# Patient Record
Sex: Male | Born: 1955 | Race: White | Hispanic: No | Marital: Single | Smoking: Current every day smoker
Health system: Southern US, Community
[De-identification: ages and names within clinical notes are randomized; demographics above are authoritative.]

## PROBLEM LIST (undated history)

## (undated) DIAGNOSIS — E7800 Pure hypercholesterolemia, unspecified: Secondary | ICD-10-CM

---

## 2013-03-26 ENCOUNTER — Observation Stay (HOSPITAL_COMMUNITY)
Admission: EM | Admit: 2013-03-26 | Discharge: 2013-03-27 | Disposition: A | Discharge status: Home / Self Care | Payer: Non-veteran care | Attending: Family Medicine | Admitting: Family Medicine

## 2013-03-26 ENCOUNTER — Encounter (HOSPITAL_COMMUNITY): Payer: Self-pay | Admitting: Emergency Medicine

## 2013-03-26 ENCOUNTER — Emergency Department (HOSPITAL_COMMUNITY): Payer: Non-veteran care

## 2013-03-26 DIAGNOSIS — R079 Chest pain, unspecified: Principal | ICD-10-CM | POA: Insufficient documentation

## 2013-03-26 DIAGNOSIS — I4510 Unspecified right bundle-branch block: Secondary | ICD-10-CM | POA: Insufficient documentation

## 2013-03-26 DIAGNOSIS — E785 Hyperlipidemia, unspecified: Secondary | ICD-10-CM | POA: Insufficient documentation

## 2013-03-26 DIAGNOSIS — F17200 Nicotine dependence, unspecified, uncomplicated: Secondary | ICD-10-CM | POA: Insufficient documentation

## 2013-03-26 DIAGNOSIS — R002 Palpitations: Secondary | ICD-10-CM | POA: Insufficient documentation

## 2013-03-26 HISTORY — DX: Pure hypercholesterolemia, unspecified: E78.00

## 2013-03-26 LAB — CBC WITH DIFFERENTIAL/PLATELET
Basophils Absolute: 0 10*3/uL (ref 0.0–0.1)
Basophils Relative: 0 % (ref 0–1)
Eosinophils Absolute: 0.3 10*3/uL (ref 0.0–0.7)
Eosinophils Relative: 4 % (ref 0–5)
HCT: 44.4 % (ref 39.0–52.0)
HEMOGLOBIN: 15.6 g/dL (ref 13.0–17.0)
LYMPHS ABS: 2.3 10*3/uL (ref 0.7–4.0)
LYMPHS PCT: 32 % (ref 12–46)
MCH: 32.5 pg (ref 26.0–34.0)
MCHC: 35.1 g/dL (ref 30.0–36.0)
MCV: 92.5 fL (ref 78.0–100.0)
MONOS PCT: 8 % (ref 3–12)
Monocytes Absolute: 0.6 10*3/uL (ref 0.1–1.0)
NEUTROS ABS: 4.1 10*3/uL (ref 1.7–7.7)
NEUTROS PCT: 57 % (ref 43–77)
PLATELETS: 177 10*3/uL (ref 150–400)
RBC: 4.8 MIL/uL (ref 4.22–5.81)
RDW: 12.7 % (ref 11.5–15.5)
WBC: 7.2 10*3/uL (ref 4.0–10.5)

## 2013-03-26 LAB — BASIC METABOLIC PANEL
BUN: 14 mg/dL (ref 6–23)
CHLORIDE: 102 meq/L (ref 96–112)
CO2: 25 mEq/L (ref 19–32)
Calcium: 9 mg/dL (ref 8.4–10.5)
Creatinine, Ser: 0.71 mg/dL (ref 0.50–1.35)
GFR calc Af Amer: >90 mL/min (ref >90)
GLUCOSE: 117 mg/dL — AB (ref 70–99)
Potassium: 3.7 mEq/L (ref 3.7–5.3)
SODIUM: 141 meq/L (ref 137–147)

## 2013-03-26 LAB — TROPONIN I: Troponin I: <0.3 ng/mL (ref <0.30)

## 2013-03-26 MED ORDER — ALPRAZOLAM 0.25 MG PO TABS
0.2500 mg | ORAL_TABLET | Freq: Three times a day (TID) | ORAL | Status: DC | PRN
  Administered 2013-03-26: 0.25 mg via ORAL
  Filled 2013-03-26: qty 1

## 2013-03-26 MED ORDER — MORPHINE SULFATE 2 MG/ML IJ SOLN: 2.0000 mg | INTRAMUSCULAR | Status: DC | PRN

## 2013-03-26 MED ORDER — ACETAMINOPHEN 500 MG PO TABS
1000.0000 mg | ORAL_TABLET | Freq: Once | ORAL | Status: AC
  Administered 2013-03-26: 1000 mg via ORAL
  Filled 2013-03-26: qty 2

## 2013-03-26 MED ORDER — ENOXAPARIN SODIUM 40 MG/0.4ML ~~LOC~~ SOLN
40.0000 mg | SUBCUTANEOUS | Status: DC
  Administered 2013-03-26: 40 mg via SUBCUTANEOUS
  Filled 2013-03-26: qty 0.4

## 2013-03-26 MED ORDER — SODIUM CHLORIDE 0.9 % IJ SOLN
3.0000 mL | Freq: Two times a day (BID) | INTRAMUSCULAR | Status: DC
  Administered 2013-03-26 – 2013-03-27 (×2): 3 mL via INTRAVENOUS

## 2013-03-26 MED ORDER — SODIUM CHLORIDE 0.9 % IV SOLN: 250.0000 mL | INTRAVENOUS | Status: DC | PRN

## 2013-03-26 MED ORDER — SODIUM CHLORIDE 0.9 % IJ SOLN: 3.0000 mL | INTRAMUSCULAR | Status: DC | PRN

## 2013-03-26 NOTE — H&P (Signed)
PCP:   No PCP Per Patient   Chief Complaint:  Palpitations  HPI: 58 year old male with a history of hyperlipidemia, tobacco abuse,? Anxiety who came to the ED after patient had palpitations with dizziness. Patient says that he has several weeks of palpitations usually it is associated with dizziness and chest heaviness. He had episode of palpitations last week and again today also had chest pressure which was nonradiating. Patient also complains of profuse sweating at the time of this episode which only lasted for 2 minutes. Patient does report increased stress report and thought that his symptoms are due to anxiety. Patient does not have history of CAD he does smoke cigarettes there is no significant family history of CAD. Patient has history of hyperlipidemia. In the ED, cardiac enzymes x1 are negative, EKG shows incomplete right bundle branch block with inverted T waves in lead 3 and flattening in aVF.  Allergies:  No Known Allergies    Past Medical History  Diagnosis Date  . Hypercholesterolemia     History reviewed. No pertinent past surgical history.  Prior to Admission medications   Medication Sig Start Date End Date Taking? Authorizing Provider  acetaminophen (TYLENOL) 500 MG tablet Take 500 mg by mouth every 6 (six) hours as needed for mild pain.   Yes Historical Provider, MD  DM-Doxylamine-Acetaminophen (NYQUIL COLD & FLU PO) Take 30 mLs by mouth once as needed (congestion).   Yes Historical Provider, MD    Social History:  reports that he has been smoking.  He does not have any smokeless tobacco history on file. He reports that he drinks alcohol. He reports that he does not use illicit drugs.  No family history on file.   All the positives are listed in BOLD  Review of Systems:  HEENT: Headache, blurred vision, runny nose, sore throat Neck: Hypothyroidism, hyperthyroidism,,lymphadenopathy Chest : Shortness of breath, history of COPD, Asthma Heart : Chest pain,  history of coronary arterey disease GI:  Nausea, vomiting, diarrhea, constipation, GERD GU: Dysuria, urgency, frequency of urination, hematuria Neuro: Stroke, seizures, syncope Psych: Depression, anxiety, hallucinations   Physical Exam: Blood pressure 108/69, pulse 61, temperature 98.2 F (36.8 C), temperature source Oral, resp. rate 16, height _0  (1.702 m), weight 85.73 kg (189 lb), SpO2 96.00%. Constitutional:   Patient is a well-developed and well-nourished *male in no acute distress and cooperative with exam. Head: Normocephalic and atraumatic Mouth: Mucus membranes moist Eyes: PERRL, EOMI, conjunctivae normal Neck: Supple, No Thyromegaly Cardiovascular: RRR, S1 normal, S2 normal Pulmonary/Chest: CTAB, no wheezes, rales, or rhonchi Abdominal: Soft. Non-tender, non-distended, bowel sounds are normal, no masses, organomegaly, or guarding present.  Neurological: A&O x3, Strenght is normal and symmetric bilaterally, cranial nerve II-XII are grossly intact, no focal motor deficit, sensory intact to light touch bilaterally.  Extremities : No Cyanosis, Clubbing or Edema   Labs on Admission:  Results for orders placed during the hospital encounter of 03/26/13 (from the past 48 hour(s))  CBC WITH DIFFERENTIAL     Status: None   Collection Time    03/26/13  6:55 PM      Result Value Ref Range   WBC 7.2  4.0 - 10.5 K/uL   RBC 4.80  4.22 - 5.81 MIL/uL   Hemoglobin 15.6  13.0 - 17.0 g/dL   HCT 44.4  39.0 - 52.0 %   MCV 92.5  78.0 - 100.0 fL   MCH 32.5  26.0 - 34.0 pg   MCHC 35.1  30.0 - 36.0 g/dL  RDW 12.7  11.5 - 15.5 %   Platelets 177  150 - 400 K/uL   Neutrophils Relative % 57  43 - 77 %   Neutro Abs 4.1  1.7 - 7.7 K/uL   Lymphocytes Relative 32  12 - 46 %   Lymphs Abs 2.3  0.7 - 4.0 K/uL   Monocytes Relative 8  3 - 12 %   Monocytes Absolute 0.6  0.1 - 1.0 K/uL   Eosinophils Relative 4  0 - 5 %   Eosinophils Absolute 0.3  0.0 - 0.7 K/uL   Basophils Relative 0  0 - 1 %    Basophils Absolute 0.0  0.0 - 0.1 K/uL  BASIC METABOLIC PANEL     Status: Abnormal   Collection Time    03/26/13  6:55 PM      Result Value Ref Range   Sodium 141  137 - 147 mEq/L   Potassium 3.7  3.7 - 5.3 mEq/L   Chloride 102  96 - 112 mEq/L   CO2 25  19 - 32 mEq/L   Glucose, Bld 117 (*) 70 - 99 mg/dL   BUN 14  6 - 23 mg/dL   Creatinine, Ser 0.71  0.50 - 1.35 mg/dL   Calcium 9.0  8.4 - 10.5 mg/dL   GFR calc non Af Amer >90  >90 mL/min   GFR calc Af Amer >90  >90 mL/min   Comment: (NOTE)     The eGFR has been calculated using the CKD EPI equation.     This calculation has not been validated in all clinical situations.     eGFR's persistently <90 mL/min signify possible Chronic Kidney     Disease.  TROPONIN I     Status: None   Collection Time    03/26/13  6:55 PM      Result Value Ref Range   Troponin I <0.30  <0.30 ng/mL   Comment:            Due to the release kinetics of cTnI,     a negative result within the first hours     of the onset of symptoms does not rule out     myocardial infarction with certainty.     If myocardial infarction is still suspected,     repeat the test at appropriate intervals.    Radiological Exams on Admission: Dg Chest 2 View  03/26/2013   CLINICAL DATA:  Palpitations.  Chills.  EXAM: CHEST  2 VIEW  COMPARISON:  None.  FINDINGS: The heart size and mediastinal contours are within normal limits. Both lungs are clear. The visualized skeletal structures are unremarkable.  IMPRESSION: No acute abnormality.   Electronically Signed   By: Rozetta Nunnery M.D.   On: 03/26/2013 20:20    Assessment/Plan Active Problems:   Chest pain Hyperlipidemia Palpitations  Chest pain Patient has been getting these episodes of chest pain with palpitations which last for a few minutes. Will obtain cardiac enzymes x3, 2-D echo in a.m. Patient is chest pain-free at this time.  Palpitations ? Arrhythmia We'll admit the patient under telemetry, made Holter monitor as  outpatient.  Hyperlipidemia Will obtain lipid profile in a.m.  Anxiety Start Xanax 0.25 3 times a day when necessary.  DVT prophylaxis Lovenox  Code status:  Family discussion:   Time Spent on Admission: 60 min  [facility] Pager: [phone] 03/26/2013, 10:10 PM  If 7PM-7AM, please contact night-coverage  [email or web address]  Password [email or web address]

## 2013-03-26 NOTE — ED Provider Notes (Signed)
CSN: [id number]     Arrival date & time 03/26/13  1815 History   First MD Initiated Contact with Patient 03/26/13 1819     Chief Complaint  Patient presents with  . Palpitations     (Consider location/radiation/quality/duration/timing/severity/associated sxs/prior Treatment) HPI  This 58 year old male with history of hyperlipidemia who presents with palpitations and dizziness. Patient reports several weeks of palpitations. He states that sometimes this happened at work and he feels associated dizziness. He also reports nausea. He had an episode of palpitations last week and again today. Last night he reports chest pressure that is nonradiating. He is currently chest pain-free. He denies any history of heart attack. He denies any early family history. He is a current smoker.  Patient does report increased stress at work and is unsure whether his symptoms are related to anxiety.  Regarding patient's dizziness, he describes feeling off balance. He denies any room spinning dizziness, focal weakness or numbness the  Past Medical History  Diagnosis Date  . Hypercholesterolemia    History reviewed. No pertinent past surgical history. No family history on file. History  Substance Use Topics  . Smoking status: Current Every Day Smoker  . Smokeless tobacco: Not on file  . Alcohol Use: Yes     Comment: occ    Review of Systems  Constitutional: Negative.  Negative for fever.  Respiratory: Positive for chest tightness. Negative for shortness of breath.   Cardiovascular: Positive for chest pain and palpitations. Negative for leg swelling.  Gastrointestinal: Negative.  Negative for abdominal pain.  Genitourinary: Negative.  Negative for dysuria.  Musculoskeletal: Negative for back pain.  Skin: Negative for rash.  Neurological: Positive for dizziness. Negative for syncope and headaches.  All other systems reviewed and are negative.      Allergies  Review of patient's allergies indicates  no known allergies.  Home Medications   Current Outpatient Rx  Name  Route  Sig  Dispense  Refill  . acetaminophen (TYLENOL) 500 MG tablet   Oral   Take 500 mg by mouth every 6 (six) hours as needed for mild pain.         Marland Kitchen. DM-Doxylamine-Acetaminophen (NYQUIL COLD & FLU PO)   Oral   Take 30 mLs by mouth once as needed (congestion).          BP 104/61  Pulse 69  Temp(Src) 98.2 F (36.8 C) (Oral)  Resp 20  Ht 5\' 7"  (1.702 m)  Wt 189 lb (85.73 kg)  BMI 29.59 kg/m2  SpO2 99% Physical Exam  Nursing note and vitals reviewed. Constitutional: He is oriented to person, place, and time. He appears well-developed and well-nourished. No distress.  HENT:  Head: Normocephalic and atraumatic.  Eyes: Pupils are equal, round, and reactive to light.  Neck: Neck supple. No JVD present.  Cardiovascular: Normal rate, regular rhythm and normal heart sounds.   No murmur heard. Pulmonary/Chest: Effort normal and breath sounds normal. No respiratory distress. He has no wheezes.  Abdominal: Soft. Bowel sounds are normal. There is no tenderness. There is no rebound.  Musculoskeletal: He exhibits no edema.  Lymphadenopathy:    He has no cervical adenopathy.  Neurological: He is alert and oriented to person, place, and time.  Skin: Skin is warm and dry.  Psychiatric: He has a normal mood and affect.    ED Course  Procedures (including critical care time) Labs Review Labs Reviewed  BASIC METABOLIC PANEL - Abnormal; Notable for the following:    Glucose, Bld 117 (*)  All other components within normal limits  CBC WITH DIFFERENTIAL  TROPONIN I   Imaging Review Dg Chest 2 View  03/26/2013   CLINICAL DATA:  Palpitations.  Chills.  EXAM: CHEST  2 VIEW  COMPARISON:  None.  FINDINGS: The heart size and mediastinal contours are within normal limits. Both lungs are clear. The visualized skeletal structures are unremarkable.  IMPRESSION: No acute abnormality.   Electronically Signed   By: Geanie Cooley M.D.   On: 03/26/2013 20:20    EKG Interpretation   None      EKG independently reviewed by myself: Normal sinus rhythm with rate 79, incomplete right bundle branch block inverted T waves in lead 3 with flattening in aVF MDM   Final diagnoses:  Chest pain    Patient presents with palpitations, dizziness, and intermittent chest pain. He is currently chest pain-free. EKG shows incomplete right bundle branch block and inverted T waves. I have no prior for comparison. Patient was given aspirin. Basic labwork was obtained including troponin which is negative. Patient has remained chest pain-free while in the emergency room. He has had no signs of arrhythmia on monitoring. Given risk factors and abnormal EKG, patient will be admitted for chest pain rule out and cardiac monitoring for arrhythmia.    Shon Baton, MD 03/26/13 2107

## 2013-03-26 NOTE — ED Notes (Signed)
Pt reports has had several episodes of heart fluttering for the past 2 weeks.  Reports dizziness associated with palpitations.  Reports at times feels pressure in chest and nausea.  Pt thought maybe the palpitations were coming from anxiety but wants to be sure.

## 2013-03-26 NOTE — ED Notes (Signed)
Anxious, "feels stupid" for being here. However does still feel "dizzyheaded" occasionally. Agrees to hospital admission. Concerned about insurance as he is [facility].

## 2013-03-27 ENCOUNTER — Telehealth: Payer: Self-pay | Admitting: *Deleted

## 2013-03-27 DIAGNOSIS — R002 Palpitations: Secondary | ICD-10-CM | POA: Diagnosis present

## 2013-03-27 DIAGNOSIS — R079 Chest pain, unspecified: Secondary | ICD-10-CM

## 2013-03-27 DIAGNOSIS — F17200 Nicotine dependence, unspecified, uncomplicated: Secondary | ICD-10-CM | POA: Diagnosis present

## 2013-03-27 LAB — LIPID PANEL
CHOL/HDL RATIO: 3.3 ratio
CHOLESTEROL: 160 mg/dL (ref 0–200)
HDL: 48 mg/dL (ref >39)
LDL Cholesterol: 88 mg/dL (ref 0–99)
Triglycerides: 118 mg/dL (ref <150)
VLDL: 24 mg/dL (ref 0–40)

## 2013-03-27 LAB — TROPONIN I
Troponin I: <0.3 ng/mL (ref <0.30)
Troponin I: <0.3 ng/mL (ref <0.30)

## 2013-03-27 LAB — TSH: TSH: 1.968 u[IU]/mL (ref 0.350–4.500)

## 2013-03-27 MED ORDER — ALPRAZOLAM 0.25 MG PO TABS: 0.2500 mg | ORAL_TABLET | Freq: Two times a day (BID) | ORAL | Status: DC | PRN

## 2013-03-27 NOTE — Progress Notes (Signed)
UR chart review completed.  

## 2013-03-27 NOTE — Care Management Note (Signed)
    Page 1 of 1   03/27/2013     11:45:34 AM   CARE MANAGEMENT NOTE 03/27/2013  Patient:  Miguel Anderson,Miguel Anderson   Account Number:  [id number]  Date Initiated:  03/27/2013  Documentation initiated by:  Sharrie RothmanBLACKWELL,Yaslyn Cumby C  Subjective/Objective Assessment:   Pt admitted from home with CP and palpitations. Pt lives with his son and is very independent with ADL's. [facility] Pt will return home at discharge.     Action/Plan:   CM has attempted to contact [facility] to notify of pts admission. WIll continue to try. Pt for discharge later today or tomorrow.   Anticipated DC Date:  03/28/2013   Anticipated DC Plan:  HOME/SELF CARE      DC Planning Services  CM consult  [facility] referrals / transfers      Choice offered to / List presented to:             Status of service:  Completed, signed off Medicare Important Message given?   (If response is "NO", the following Medicare IM given date fields will be blank) Date Medicare IM given:   Date Additional Medicare IM given:    Discharge Disposition:  HOME/SELF CARE  Per UR Regulation:    If discussed at Long Length of Stay Meetings, dates discussed:    Comments:  03/27/13 1145 Arlyss Queenammy Hayslee Casebolt, RN BSN CM

## 2013-03-27 NOTE — Telephone Encounter (Signed)
Message copied by Thompson GrayerURHAM, Margarie Mcguirt C on Wed Mar 27, 2013  4:14 PM ------      Message from: San JettyJACKSON, TANYA M      Created: Wed Mar 27, 2013  2:56 PM       Pt needs 2 week event monitor per Dr Wyline MoodBranch then 2-3 weeks f/u after that has been turned in ------

## 2013-03-27 NOTE — Progress Notes (Signed)
Patient seen, independently examined and chart reviewed. I agree with exam, assessment and plan discussed with Toya SmothersKaren Black, NP.   I am currently unable to sign d/c summary from Ms. Black secondary to Epic malfunction ("call to server" error). This note serves as my Scientist, forensiccosign.  58 year old man with a history of cardiac disease presented with several week history of palpitations associated with dizziness and chest heaviness as well as profuse sweating. No shortness of breath, no leg swelling. Did report drinking energy drinks regularly.   PMH  Hyperlipidemia.  Cigarette smoker   Subjective:  Feels fine now. No chest pain, shortness of breath. No palpitations.   Objective:  Afebrile, vital signs stable. Appears calm and comfortable. Speech fluent and clear. Cardiovascular regular rate and rhythm. Respiratory clear to auscultation bilaterally. No wheezes, rales or rhonchi. Normal respiratory effort. No lower extremity edema.   TSH was normal. Troponins negative. Basic metabolic panel normal. LDL 88. CBC normal. Chest x-ray no acute disease. EKG shows sinus rhythm, incomplete right bundle branch block pattern, no acute changes, no significant change compared to previous study (Twave inversion III resolved).   The patient was seen in consultation with cardiology. Impression was palpitations, suggestive of symptomatic arrhythmia, not captured. Case was discussed with Dr. Wyline MoodBranch who felt the patient be discharged home with outpatient cardiology followup, 2-D echocardiogram which could not be completed this calendar day and outpatient monitor. I concur with his impression. No history to suggest ACS or VTE.   Cutback on caffeine at home .  Agree with discharge today. Discussed current limitations or diagnostic workup, and plan for 2-D echocardiogram and monitor as an outpatient and that patient should seek immediate medical attention for recurrent symptoms.  No Rx for Xanax on discharge.    Medication  List         acetaminophen 500 MG tablet    Commonly known as: TYLENOL    Take 500 mg by mouth every 6 (six) hours as needed for mild pain.    NYQUIL COLD & FLU PO    Take 30 mLs by mouth once as needed (congestion).      Brendia Sacksaniel Daytona Retana, MD  [facility]  [phone]

## 2013-03-27 NOTE — Progress Notes (Deleted)
Patient seen, independently examined and chart reviewed. I agree with exam, assessment and plan discussed with Toya SmothersKaren Black, NP.  [age] year old man with a history of cardiac disease presented with several week history of palpitations associated with dizziness and chest heaviness as well as profuse sweating. No shortness of breath, no leg swelling. Did report drinking energy drinks regularly.  PMH Hyperlipidemia. Cigarette smoker  Subjective: Feels fine now. No chest pain, shortness of breath. No palpitations.  Objective: Afebrile, vital signs stable. Appears calm and comfortable. Speech fluent and clear. Cardiovascular regular rate and rhythm. Respiratory clear to auscultation bilaterally. No wheezes, rales or rhonchi. Normal respiratory effort. No lower extremity edema.  TSH was normal. Troponins negative. Basic metabolic panel normal. LDL 88. CBC normal. Chest x-ray no acute disease. EKG shows sinus rhythm, incomplete right bundle branch block pattern, no acute changes, no significant change compared to previous study (Twave inversion III resolved).  The patient was seen in consultation with cardiology. Impression was palpitations, suggestive of symptomatic arrhythmia, not captured. Case was discussed with Dr. Wyline MoodBranch who felt the patient be discharged home with outpatient cardiology followup, 2-D echocardiogram which could not be completed this calendar day and outpatient monitor. I concur with his impression. No history to suggest ACS or VTE.  Cutback on caffeine at home.  Current discharge today. Discussed current limitations or diagnostic workup, and plan for 2-D echocardiogram and monitor as an outpatient and that patient should seek immediate medical attention for recurrent symptoms.  No Rx for Xanax on discharge.    Medication List         acetaminophen 500 MG tablet  Commonly known as:  TYLENOL  Take 500 mg by mouth every 6 (six) hours as needed for mild pain.     NYQUIL COLD &  FLU PO  Take 30 mLs by mouth once as needed (congestion).         Brendia Sacksaniel Goodrich, MD [facility] [phone]

## 2013-03-27 NOTE — Consult Note (Signed)
Consulting cardiologist: Dina RichJonathan Idan Prime MD  Clinical Summary [name] is a 58 y.o.male history of hyperlipidemia and tobacco admitted with palpitations and dizziness. Reports episodes of the last 2 weeks. Feeling of heart pounding/racing, with associated dizziness and occasional chest pressure. Lasts for approx 30 seconds. Denies any chest pain aside from that associated with palpitations, no exertional chest pain symptoms. Denies significant EtOH use, does report drinking energy drinks on a regular basis.   Trop neg x 4, K 3.7, Cr 0.71, GFR >90, TSH pending, echo pending. I cannot find his EKG in Epic or in the chart.    No Known Allergies  Medications Scheduled Medications: . enoxaparin (LOVENOX) injection  40 mg Subcutaneous Q24H  . sodium chloride  3 mL Intravenous Q12H     Infusions:     PRN Medications:  sodium chloride, ALPRAZolam, morphine injection, sodium chloride   Past Medical History  Diagnosis Date  . Hypercholesterolemia     History reviewed. No pertinent past surgical history.  History reviewed. No pertinent family history.  Social History [name] reports that he has been smoking.  He does not have any smokeless tobacco history on file. [name] reports that he drinks alcohol.  Review of Systems CONSTITUTIONAL: No weight loss, fever, chills, weakness or fatigue.  HEENT: Eyes: No visual loss, blurred vision, double vision or yellow sclerae. No hearing loss, sneezing, congestion, runny nose or sore throat.  SKIN: No rash or itching.  CARDIOVASCULAR: per HPI RESPIRATORY: No shortness of breath, cough or sputum.  GASTROINTESTINAL: No anorexia, nausea, vomiting or diarrhea. No abdominal pain or blood.  GENITOURINARY: no polyuria, no dysuria NEUROLOGICAL: No headache, dizziness, syncope, paralysis, ataxia, numbness or tingling in the extremities. No change in bowel or bladder control.  MUSCULOSKELETAL: No muscle, back pain, joint pain or  stiffness.  HEMATOLOGIC: No anemia, bleeding or bruising.  LYMPHATICS: No enlarged nodes. No history of splenectomy.  PSYCHIATRIC: No history of depression or anxiety.      Physical Examination Blood pressure 105/68, pulse 58, temperature 97.6 F (36.4 C), temperature source Oral, resp. rate 15, height 5\' 7"  (1.702 m), weight 192 lb 14.4 oz (87.5 kg), SpO2 96.00%. No intake or output data in the 24 hours ending 03/27/13 1056  Cardiovascular: RRR, no m/r/g, no JVD, no carotid bruits  Respiratory: CTAB  GI: abdomen soft, NT, ND  MSK: no LE edema  Neuro: no focal deficits   Lab Results  Basic Metabolic Panel:  Recent Labs Lab 03/26/13 1855  NA 141  K 3.7  CL 102  CO2 25  GLUCOSE 117*  BUN 14  CREATININE 0.71  CALCIUM 9.0   TC 160 TG 118 HDL 48 LDL 88 Liver Function Tests: No results found for this basename: AST, ALT, ALKPHOS, BILITOT, PROT, ALBUMIN,  in the last 168 hours  CBC:  Recent Labs Lab 03/26/13 1855  WBC 7.2  NEUTROABS 4.1  HGB 15.6  HCT 44.4  MCV 92.5  PLT 177    Cardiac Enzymes:  Recent Labs Lab 03/26/13 1855 03/26/13 2228 03/27/13 0512 03/27/13 1012  TROPONINI <0.30 <0.30 <0.30 <0.30    BNP: No components found with this basename: POCBNP,    ECG Pending  Imaging Echo pending  Impression/Recommendations 1. Palpitations - symptoms suggestive of symptomatic arrhythmia. Have not captured on EKG or monitor at this point. - will follow up echo results and TSH. Repeat EKG as I am not able to find prior.  - pending echo results, will need a home  event monitor set up with cardiology follow up.  - will need to cut back on caffeine at honme  Dina Rich, M.D., F.A.C.C.

## 2013-03-27 NOTE — Progress Notes (Signed)
Patient states understanding of discharge instructions.  

## 2013-03-27 NOTE — Progress Notes (Signed)
Physician Discharge Summary  Miguel MangesDaniel Anderson ZOX:[id number] DOB: Apr 28, 1955 DOA: 03/26/2013  PCP: No PCP Per Patient  Admit date: 03/26/2013 Discharge date: 03/27/2013  Time spent: 40 minutes  Recommendations for Outpatient Follow-up:  1. Dr. Verna CzechBranch's office will call to arrange home holter monitor as well as OP echo. Will also schedule follow up appointment.  2. PCP at [facility]. Recommend follow up in 1-2 weeks. Recommend follow up on glucose level and smoking cessation.   Discharge Diagnoses:  Active Problems:   Chest pain   Palpitations   Tobacco use disorder   Discharge Condition: stable  Diet recommendation: heart healthy  Filed Weights   03/26/13 1827 03/26/13 2258  Weight: 85.73 kg (189 lb) 87.5 kg (192 lb 14.4 oz)    History of present illness:  58 year old male with a history of hyperlipidemia, tobacco abuse,? Anxiety who came to the ED on 03/26/13 after having palpitations with dizziness. Patient said that he has had several weeks of palpitations usually associated with dizziness and chest heaviness. He had episode of palpitations last week and again on day of admission with chest pressure which was nonradiating. Patient also complained of profuse sweating at the time of this episode which only lasted for 2 minutes. Patient reported increased stress and thought that his symptoms were due to anxiety. Patient does not have history of CAD he does smoke cigarettes there is no significant family history of CAD. Patient has history of hyperlipidemia.   In the ED, cardiac enzymes x1 are negative, EKG showed incomplete right bundle branch block with inverted T waves in lead 3 and flattening in aVF.  Hospital Course:  Chest pain  Atypical. Risk factors include hyperlipidemia, smoker. No further episodes during hospitalization. No events on tele.  Cardiac enzymes negative x4. EKG reportedly in ED with NSR and incomplete RBB with inverted Twaves in lead 3. Unable to locate this EKG. Repeat  EKG on day of discharge with NSR incomplete RBBB no longer T Wave inversion. Lipid panel within the limits of normal. Patient evaluated by Dr. Wyline MoodBranch who opined symptoms suggestive of symptomatic arrythmia. No indication of ACS.  2-D echo will be arranged OP per Dr. Verna CzechBranch's office. Follow up visit to evaluate data and echo will be arranged per Dr. Reginia NaasBranches office as well.    Palpitations  No events on tele. TSH 1.968. Patient evaluated by Dr Wyline MoodBranch cardiology who opined symptoms suggestive of symptomatic arrythmia. He recommended home Holter monitor. His office will contact patient to arrange. He also agreed 2-d Echo could be OP and his office will arrange as well. He also recommended no caffeine and avoidance of energy drinks.  Patient had no further episodes during hospitalization.  .  Hyperlipidemia  Lipid panel within the limits of normal  Tobacco use: cessation counseling offered. He is not interested in quitting.    Anxiety  Provided  Xanax 0.25 3 times a day when necessary.   Procedures:  none  Consultations:  Dr. Wyline MoodBranch cardiology  Discharge Exam: Filed Vitals:   03/27/13 1500  BP: 102/71  Pulse: 66  Temp: 98.1 F (36.7 C)  Resp: 16    General: well nourished appears comfortable Cardiovascular: RRR No MGR No LE edema chest non-tender to palpation Respiratory: normal effort BS slightly distant. No wheeze no rhonchi  Discharge Instructions       Future Appointments Provider Department Dept Phone   04/03/2013 2:00 PM [facility] [phone]   05/13/2013 1:20 PM [name], MD [facility] [place] [phone]       Medication List         acetaminophen 500 MG tablet  Commonly known as:  TYLENOL  Take 500 mg by mouth every 6 (six) hours as needed for mild pain.     ALPRAZolam 0.25 MG tablet  Commonly known as:  XANAX  Take 1 tablet (0.25 mg total) by mouth 2 (two) times daily as needed for anxiety.      NYQUIL COLD & FLU PO  Take 30 mLs by mouth once as needed (congestion).       No Known Allergies Follow-up Information   Follow up with Dina Rich, F, MD. (office will call to arrange for home holter monitor and will schedule follow up appoitment as well. )    Specialty:  Cardiology   Contact information:   [address] [place] [address] [phone]        The results of significant diagnostics from this hospitalization (including imaging, microbiology, ancillary and laboratory) are listed below for reference.    Significant Diagnostic Studies: Dg Chest 2 View  03/26/2013   CLINICAL DATA:  Palpitations.  Chills.  EXAM: CHEST  2 VIEW  COMPARISON:  None.  FINDINGS: The heart size and mediastinal contours are within normal limits. Both lungs are clear. The visualized skeletal structures are unremarkable.  IMPRESSION: No acute abnormality.   Electronically Signed   By: Geanie Cooley M.D.   On: 03/26/2013 20:20    Microbiology: No results found for this or any previous visit (from the past 240 hour(s)).   Labs: Basic Metabolic Panel:  Recent Labs Lab 03/26/13 1855  NA 141  K 3.7  CL 102  CO2 25  GLUCOSE 117*  BUN 14  CREATININE 0.71  CALCIUM 9.0   Liver Function Tests: No results found for this basename: AST, ALT, ALKPHOS, BILITOT, PROT, ALBUMIN,  in the last 168 hours No results found for this basename: LIPASE, AMYLASE,  in the last 168 hours No results found for this basename: AMMONIA,  in the last 168 hours CBC:  Recent Labs Lab 03/26/13 1855  WBC 7.2  NEUTROABS 4.1  HGB 15.6  HCT 44.4  MCV 92.5  PLT 177   Cardiac Enzymes:  Recent Labs Lab 03/26/13 1855 03/26/13 2228 03/27/13 0512 03/27/13 1012  TROPONINI <0.30 <0.30 <0.30 <0.30   BNP: BNP (last 3 results) No results found for this basename: PROBNP,  in the last 8760 hours CBG: No results found for this basename: GLUCAP,  in the last 168 hours     Signed:  Gwenyth Bender  [facility] 03/27/2013, 4:05 PM

## 2013-03-28 ENCOUNTER — Telehealth: Payer: Self-pay | Admitting: *Deleted

## 2013-03-28 DIAGNOSIS — R079 Chest pain, unspecified: Secondary | ICD-10-CM

## 2013-03-28 DIAGNOSIS — R002 Palpitations: Secondary | ICD-10-CM

## 2013-03-28 NOTE — Progress Notes (Signed)
Patient seen, independently examined and chart reviewed. I agree with exam, assessment and plan discussed with Toya SmothersKaren Black, NP.   58 year old man with a history of cardiac disease presented with several week history of palpitations associated with dizziness and chest heaviness as well as profuse sweating. No shortness of breath, no leg swelling. Did report drinking energy drinks regularly.   PMH  Hyperlipidemia.  Cigarette smoker   Subjective:  Feels fine now. No chest pain, shortness of breath. No palpitations.   Objective:  Afebrile, vital signs stable. Appears calm and comfortable. Speech fluent and clear. Cardiovascular regular rate and rhythm. Respiratory clear to auscultation bilaterally. No wheezes, rales or rhonchi. Normal respiratory effort. No lower extremity edema.   TSH was normal. Troponins negative. Basic metabolic panel normal. LDL 88. CBC normal. Chest x-ray no acute disease. EKG shows sinus rhythm, incomplete right bundle branch block pattern, no acute changes, no significant change compared to previous study (Twave inversion III resolved).   The patient was seen in consultation with cardiology. Impression was palpitations, suggestive of symptomatic arrhythmia, not captured. Case was discussed with Dr. Wyline MoodBranch who felt the patient be discharged home with outpatient cardiology followup, 2-D echocardiogram which could not be completed this calendar day and outpatient monitor. I concur with his impression. No history to suggest ACS or VTE.  Cutback on caffeine at home  .  Agree with discharge today. Discussed current limitations or diagnostic workup, and plan for 2-D echocardiogram and monitor as an outpatient and that patient should seek immediate medical attention for recurrent symptoms.   No Rx for Xanax on discharge.     Medication List         acetaminophen 500 MG tablet    Commonly known as: TYLENOL    Take 500 mg by mouth every 6 (six) hours as needed for mild pain.     NYQUIL COLD & FLU PO    Take 30 mLs by mouth once as needed (congestion).     Brendia Sacksaniel Goodrich, MD  [facility]  [phone]

## 2013-03-28 NOTE — Telephone Encounter (Signed)
FYI- patient does not want to have the monitor

## 2013-03-28 NOTE — Discharge Summary (Signed)
Physician Discharge Summary   Miguel MangesDaniel Tennyson [id number] DOB: 11-02-1955 DOA: 03/26/2013  PCP: No PCP Per Patient  Admit date: 03/26/2013  Discharge date: 03/27/2013  Time spent: 40 minutes  Recommendations for Outpatient Follow-up:  1. Dr. Verna CzechBranch's office will call to arrange home holter monitor as well as OP echo. Will also schedule follow up appointment.  2. PCP at [facility]. Recommend follow up in 1-2 weeks. Recommend follow up on glucose level and smoking cessation.  Discharge Diagnoses:  Active Problems:  Chest pain  Palpitations  Tobacco use disorder   Discharge Condition: stable  Diet recommendation: heart healthy  Filed Weights    03/26/13 1827  03/26/13 2258   Weight:  85.73 kg (189 lb)  87.5 kg (192 lb 14.4 oz)    History of present illness:  58 year old male with a history of hyperlipidemia, tobacco abuse,? Anxiety who came to the ED on 03/26/13 after having palpitations with dizziness. Patient said that he has had several weeks of palpitations usually associated with dizziness and chest heaviness. He had episode of palpitations last week and again on day of admission with chest pressure which was nonradiating. Patient also complained of profuse sweating at the time of this episode which only lasted for 2 minutes. Patient reported increased stress and thought that his symptoms were due to anxiety. Patient does not have history of CAD he does smoke cigarettes there is no significant family history of CAD. Patient has history of hyperlipidemia.  In the ED, cardiac enzymes x1 are negative, EKG showed incomplete right bundle branch block with inverted T waves in lead 3 and flattening in aVF.  Hospital Course:  Chest pain  Atypical. Risk factors include hyperlipidemia, smoker. No further episodes during hospitalization. No events on tele. Cardiac enzymes negative x4. EKG reportedly in ED with NSR and incomplete RBB with inverted Twaves in lead 3. Unable to locate this EKG. Repeat EKG on day  of discharge with NSR incomplete RBBB no longer T Wave inversion. Lipid panel within the limits of normal. Patient evaluated by Dr. Wyline MoodBranch who opined symptoms suggestive of symptomatic arrythmia. No indication of ACS. 2-D echo will be arranged OP per Dr. Verna CzechBranch's office. Follow up visit to evaluate data and echo will be arranged per Dr. Reginia NaasBranches office as well.  Palpitations  No events on tele. TSH 1.968. Patient evaluated by Dr Wyline MoodBranch cardiology who opined symptoms suggestive of symptomatic arrythmia. He recommended home Holter monitor. His office will contact patient to arrange. He also agreed 2-d Echo could be OP and his office will arrange as well. He also recommended no caffeine and avoidance of energy drinks. Patient had no further episodes during hospitalization.  .  Hyperlipidemia  Lipid panel within the limits of normal  Tobacco use: cessation counseling offered. He is not interested in quitting.  Anxiety  Provided Xanax 0.25 3 times a day when necessary.  Procedures:  none Consultations:  Dr. Wyline MoodBranch cardiology Discharge Exam:  Filed Vitals:    03/27/13 1500   BP:  102/71   Pulse:  66   Temp:  98.1 F (36.7 C)   Resp:  16    General: well nourished appears comfortable  Cardiovascular: RRR No MGR No LE edema chest non-tender to palpation  Respiratory: normal effort BS slightly distant. No wheeze no rhonchi  Discharge Instructions       Future Appointments  Provider  Department  Dept Phone    04/03/2013 2:00 PM  [facility]  [facility]  [phone]  05/13/2013 1:20 PM  Antoine Poche, MD  [facility]   [phone]        Medication List         acetaminophen 500 MG tablet    ALPRAZolam 0.25 MG tablet    Commonly known as: XANAX    Take 1 tablet (0.25 mg total) by mouth 2 (two) times daily as needed for anxiety.     NYQUIL COLD & FLU PO    Take 30 mLs by mouth once as needed (congestion).      No Known Allergies   Follow-up Information    Follow up with Dina Rich, F, MD. (office will call to arrange for home holter monitor and will schedule follow up appoitment as well. )    Specialty: Cardiology    Contact information:    [address]  [place] [address]  [phone]       The results of significant diagnostics from this hospitalization (including imaging, microbiology, ancillary and laboratory) are listed below for reference.   Significant Diagnostic Studies:  Dg Chest 2 View  03/26/2013 CLINICAL DATA: Palpitations. Chills. EXAM: CHEST 2 VIEW COMPARISON: None. FINDINGS: The heart size and mediastinal contours are within normal limits. Both lungs are clear. The visualized skeletal structures are unremarkable. IMPRESSION: No acute abnormality. Electronically Signed By: Geanie Cooley M.D. On: 03/26/2013 20:20  Microbiology:  No results found for this or any previous visit (from the past 240 hour(s)).  Labs:  Basic Metabolic Panel:   Recent Labs  Lab  03/26/13 1855   NA  141   K  3.7   CL  102   CO2  25   GLUCOSE  117*   BUN  14   CREATININE  0.71   CALCIUM  9.0    Liver Function Tests:  No results found for this basename: AST, ALT, ALKPHOS, BILITOT, PROT, ALBUMIN, in the last 168 hours  No results found for this basename: LIPASE, AMYLASE, in the last 168 hours  No results found for this basename: AMMONIA, in the last 168 hours  CBC:   Recent Labs  Lab  03/26/13 1855   WBC  7.2   NEUTROABS  4.1   HGB  15.6   HCT  44.4   MCV  92.5   PLT  177    Cardiac Enzymes:   Recent Labs  Lab  03/26/13 1855  03/26/13 2228  03/27/13 0512  03/27/13 1012   TROPONINI  <0.30  <0.30  <0.30  <0.30    BNP:  BNP (last 3 results)  No results found for this basename: PROBNP, in the last 8760 hours  CBG:  No results found for this basename: GLUCAP, in the last 168 hours  Signed:  Gwenyth Bender  [facility]  03/27/2013, 4:05 PM

## 2013-03-29 NOTE — Discharge Summary (Signed)
Patient seen, independently examined and chart reviewed. I agree with exam, assessment and plan discussed with Karen Black, NP.   57-year-old man with a history of cardiac disease presented with several week history of palpitations associated with dizziness and chest heaviness as well as profuse sweating. No shortness of breath, no leg swelling. Did report drinking energy drinks regularly.   PMH  Hyperlipidemia.  Cigarette smoker   Subjective:  Feels fine now. No chest pain, shortness of breath. No palpitations.   Objective:  Afebrile, vital signs stable. Appears calm and comfortable. Speech fluent and clear. Cardiovascular regular rate and rhythm. Respiratory clear to auscultation bilaterally. No wheezes, rales or rhonchi. Normal respiratory effort. No lower extremity edema.   TSH was normal. Troponins negative. Basic metabolic panel normal. LDL 88. CBC normal. Chest x-ray no acute disease. EKG shows sinus rhythm, incomplete right bundle branch block pattern, no acute changes, no significant change compared to previous study (Twave inversion III resolved).   The patient was seen in consultation with cardiology. Impression was palpitations, suggestive of symptomatic arrhythmia, not captured. Case was discussed with Dr. Branch who felt the patient be discharged home with outpatient cardiology followup, 2-D echocardiogram which could not be completed this calendar day and outpatient monitor. I concur with his impression. No history to suggest ACS or VTE.  Cutback on caffeine at home  .  Agree with discharge today. Discussed current limitations or diagnostic workup, and plan for 2-D echocardiogram and monitor as an outpatient and that patient should seek immediate medical attention for recurrent symptoms.   No Rx for Xanax on discharge.     Medication List         acetaminophen 500 MG tablet    Commonly known as: TYLENOL    Take 500 mg by mouth every 6 (six) hours as needed for mild pain.     NYQUIL COLD & FLU PO    Take 30 mLs by mouth once as needed (congestion).     Miguel Clair Bardwell, MD  [facility]  [phone]  

## 2013-03-29 NOTE — Telephone Encounter (Signed)
Why did he refuse it?   Dina RichJonathan Branch MD

## 2013-03-29 NOTE — Telephone Encounter (Signed)
Can not afford it

## 2013-04-01 NOTE — Telephone Encounter (Signed)
Dr Wyline MoodBranch wants it through the [facility]. This nurse faxed over a demographic sheet and face sheet to the number Mitch from [employer] told me. [phone]) on 03/27/13. Waiting for approval to get through [facility]. Called Mitch today to see if it was approved. Marthann SchillerMitch states he will check on it today and let this nurse know if it was approved.

## 2013-04-03 ENCOUNTER — Other Ambulatory Visit (HOSPITAL_COMMUNITY): Payer: Non-veteran care

## 2013-05-13 ENCOUNTER — Encounter: Payer: Non-veteran care | Admitting: Cardiology

## 2013-05-13 ENCOUNTER — Telehealth: Payer: Self-pay

## 2013-05-13 NOTE — Telephone Encounter (Signed)
Pt was a NoShow for echo and cancelled event monitor per Mitch at [employer] due to lack of funds.attempt to reschedule echo,home number has voicemail that is full.LM with a Sherryl MangesDaniel Wagster listed on pts contacts to cal back   I will not reschedule echo and event until I hear back from patient and he gives approval

## 2019-02-04 ENCOUNTER — Other Ambulatory Visit: Payer: Self-pay

## 2019-02-04 ENCOUNTER — Ambulatory Visit: Payer: Non-veteran care | Attending: Internal Medicine

## 2019-02-04 DIAGNOSIS — Z20822 Contact with and (suspected) exposure to covid-19: Secondary | ICD-10-CM

## 2019-02-06 LAB — NOVEL CORONAVIRUS, NAA: SARS-CoV-2, NAA: NOT DETECTED

## 2020-01-28 ENCOUNTER — Emergency Department (HOSPITAL_COMMUNITY): Payer: No Typology Code available for payment source

## 2020-01-28 ENCOUNTER — Encounter (HOSPITAL_COMMUNITY): Payer: Self-pay | Admitting: *Deleted

## 2020-01-28 ENCOUNTER — Emergency Department (HOSPITAL_COMMUNITY)
Admission: EM | Admit: 2020-01-28 | Discharge: 2020-01-28 | Disposition: A | Discharge status: Home / Self Care | Payer: No Typology Code available for payment source | Attending: Emergency Medicine | Admitting: Emergency Medicine

## 2020-01-28 ENCOUNTER — Other Ambulatory Visit: Payer: Self-pay

## 2020-01-28 DIAGNOSIS — F17200 Nicotine dependence, unspecified, uncomplicated: Secondary | ICD-10-CM | POA: Diagnosis not present

## 2020-01-28 DIAGNOSIS — J039 Acute tonsillitis, unspecified: Secondary | ICD-10-CM

## 2020-01-28 DIAGNOSIS — J029 Acute pharyngitis, unspecified: Secondary | ICD-10-CM | POA: Diagnosis present

## 2020-01-28 DIAGNOSIS — J36 Peritonsillar abscess: Secondary | ICD-10-CM | POA: Diagnosis not present

## 2020-01-28 DIAGNOSIS — Z7982 Long term (current) use of aspirin: Secondary | ICD-10-CM | POA: Insufficient documentation

## 2020-01-28 LAB — BASIC METABOLIC PANEL
Anion gap: 12 (ref 5–15)
BUN: 9 mg/dL (ref 8–23)
CO2: 29 mmol/L (ref 22–32)
Calcium: 8.7 mg/dL — ABNORMAL LOW (ref 8.9–10.3)
Chloride: 103 mmol/L (ref 98–111)
Creatinine, Ser: 0.66 mg/dL (ref 0.61–1.24)
GFR, Estimated: >60 mL/min (ref >60)
Glucose, Bld: 90 mg/dL (ref 70–99)
Potassium: 3.8 mmol/L (ref 3.5–5.1)
Sodium: 144 mmol/L (ref 135–145)

## 2020-01-28 LAB — CBC
HCT: 52.3 % — ABNORMAL HIGH (ref 39.0–52.0)
Hemoglobin: 17.9 g/dL — ABNORMAL HIGH (ref 13.0–17.0)
MCH: 34.9 pg — ABNORMAL HIGH (ref 26.0–34.0)
MCHC: 34.2 g/dL (ref 30.0–36.0)
MCV: 101.9 fL — ABNORMAL HIGH (ref 80.0–100.0)
Platelets: 173 10*3/uL (ref 150–400)
RBC: 5.13 MIL/uL (ref 4.22–5.81)
RDW: 13.1 % (ref 11.5–15.5)
WBC: 10.5 10*3/uL (ref 4.0–10.5)
nRBC: 0 % (ref 0.0–0.2)

## 2020-01-28 LAB — GROUP A STREP BY PCR: Group A Strep by PCR: NOT DETECTED

## 2020-01-28 LAB — MONONUCLEOSIS SCREEN: Mono Screen: NEGATIVE

## 2020-01-28 MED ORDER — SODIUM CHLORIDE 0.9 % IV BOLUS
1000.0000 mL | Freq: Once | INTRAVENOUS | Status: AC
  Administered 2020-01-28: 1000 mL via INTRAVENOUS

## 2020-01-28 MED ORDER — DEXAMETHASONE SODIUM PHOSPHATE 10 MG/ML IJ SOLN
8.0000 mg | Freq: Once | INTRAMUSCULAR | Status: AC
  Administered 2020-01-28: 8 mg via INTRAVENOUS
  Filled 2020-01-28: qty 1

## 2020-01-28 MED ORDER — ACETAMINOPHEN 325 MG PO TABS
650.0000 mg | ORAL_TABLET | Freq: Once | ORAL | Status: AC
  Administered 2020-01-28: 650 mg via ORAL
  Filled 2020-01-28: qty 2

## 2020-01-28 MED ORDER — IOHEXOL 300 MG/ML  SOLN
75.0000 mL | Freq: Once | INTRAMUSCULAR | Status: AC | PRN
  Administered 2020-01-28: 75 mL via INTRAVENOUS

## 2020-01-28 MED ORDER — CLINDAMYCIN HCL 150 MG PO CAPS: 450.0000 mg | ORAL_CAPSULE | Freq: Three times a day (TID) | ORAL | 0 refills | Status: AC

## 2020-01-28 MED ORDER — CLINDAMYCIN PHOSPHATE 300 MG/50ML IV SOLN
300.0000 mg | Freq: Once | INTRAVENOUS | Status: AC
  Administered 2020-01-28: 300 mg via INTRAVENOUS
  Filled 2020-01-28: qty 50

## 2020-01-28 NOTE — ED Triage Notes (Signed)
Sore throat with difficulty swallowing

## 2020-01-28 NOTE — ED Notes (Signed)
Pt c/o of pain while swallowing a sip of water. Other wise tolerated well.

## 2020-01-28 NOTE — ED Provider Notes (Signed)
[facility] EMERGENCY DEPARTMENT Provider Note   CSN: [id number] Arrival date & time: 01/28/20  1007     History Chief Complaint  Patient presents with  . Sore Throat    Miguel Anderson is a 64 y.o. male with past medical history of hypercholesteremia that presents emerge department today for sore throat.  Patient states that he started having a sore throat last night, severely progressed throughout the morning.  States that it hurts to swallow, he is having trouble speaking due to it. No true muffled voice.  No cough, shortness of breath, sinus pain, headache.  Denies any fevers, chills, nausea, vomiting.  Patient states that he was in normal health before this started yesterday.  Denies any sick contacts.  Has been vaccinated against Covid.  States that he is having trouble swallowing that started this morning, denies any secretions or drooling.  Does state that it is hard to open his mouth completely, has been worsening throughout the morning.  Has been able to drink and eat.  No other complaints.  HPI     Past Medical History:  Diagnosis Date  . Hypercholesterolemia     Patient Active Problem List   Diagnosis Date Noted  . Palpitations 03/27/2013  . Tobacco use disorder 03/27/2013  . Chest pain 03/26/2013    History reviewed. No pertinent surgical history.     No family history on file.  Social History   Tobacco Use  . Smoking status: Current Every Day Smoker    Packs/day: 1.00  . Smokeless tobacco: Never Used  Substance Use Topics  . Alcohol use: Yes    Comment: occ on weekends  . Drug use: No    Home Medications Prior to Admission medications   Medication Sig Start Date End Date Taking? Authorizing Provider  acetaminophen (TYLENOL) 500 MG tablet Take 500 mg by mouth every 6 (six) hours as needed for mild pain.   Yes [provider]  Aspirin-Caffeine (BAYER BACK & BODY PO) Take 1 tablet by mouth 2 (two) times daily.    [provider]   clindamycin (CLEOCIN) 150 MG capsule Take 3 capsules (450 mg total) by mouth 3 (three) times daily for 10 days. 01/28/20 02/07/20  Farrel Gordon, PA-C    Allergies    Patient has no known allergies.  Review of Systems   Review of Systems  Constitutional: Negative for chills, diaphoresis, fatigue and fever.  HENT: Positive for sore throat and trouble swallowing. Negative for congestion, drooling, ear discharge, facial swelling, nosebleeds, postnasal drip, rhinorrhea, sinus pressure, sinus pain and sneezing.   Eyes: Negative for pain and visual disturbance.  Respiratory: Negative for cough, shortness of breath and wheezing.   Cardiovascular: Negative for chest pain, palpitations and leg swelling.  Gastrointestinal: Negative for abdominal distention, abdominal pain, diarrhea, nausea and vomiting.  Genitourinary: Negative for difficulty urinating.  Musculoskeletal: Negative for back pain, neck pain and neck stiffness.  Skin: Negative for pallor.  Neurological: Negative for dizziness, speech difficulty, weakness and headaches.  Psychiatric/Behavioral: Negative for confusion.    Physical Exam Updated Vital Signs BP (!) 169/123   Pulse 82   Temp 98.1 F (36.7 C) (Oral) Comment: Simultaneous filing. User may not have seen previous data.  Resp 18   Ht 5\' 8"  (1.727 m)   Wt 86.2 kg   SpO2 95%   BMI 28.89 kg/m   Physical Exam Constitutional:      General: He is not in acute distress.    Appearance: Normal appearance. He  is not ill-appearing, toxic-appearing or diaphoretic.  HENT:     Mouth/Throat:     Mouth: Mucous membranes are moist.     Dentition: Dental caries present.     Pharynx: Oropharynx is clear.      Comments: Patient with 4+ tonsil on the left side, is touching uvula.  No exudate.  Patient with fullness on left peritonsillar area.  No right tonsillar enlargement.  Pharynx is erythematous.  Tongue without any swelling under tongue.  Poor dentition. Eyes:     General: No  scleral icterus.    Extraocular Movements: Extraocular movements intact.     Pupils: Pupils are equal, round, and reactive to light.  Cardiovascular:     Rate and Rhythm: Normal rate and regular rhythm.     Pulses: Normal pulses.     Heart sounds: Normal heart sounds.  Pulmonary:     Effort: Pulmonary effort is normal. No respiratory distress.     Breath sounds: Normal breath sounds. No stridor. No wheezing, rhonchi or rales.  Chest:     Chest wall: No tenderness.  Abdominal:     General: Abdomen is flat. There is no distension.     Palpations: Abdomen is soft.     Tenderness: There is no abdominal tenderness. There is no guarding or rebound.  Musculoskeletal:        General: No swelling or tenderness. Normal range of motion.     Cervical back: Normal range of motion and neck supple. No rigidity.     Right lower leg: No edema.     Left lower leg: No edema.  Skin:    General: Skin is warm and dry.     Capillary Refill: Capillary refill takes less than 2 seconds.     Coloration: Skin is not pale.  Neurological:     General: No focal deficit present.     Mental Status: He is alert and oriented to person, place, and time.  Psychiatric:        Mood and Affect: Mood normal.        Behavior: Behavior normal.     ED Results / Procedures / Treatments   Labs (all labs ordered are listed, but only abnormal results are displayed) Labs Reviewed  BASIC METABOLIC PANEL - Abnormal; Notable for the following components:      Result Value   Calcium 8.7 (*)    All other components within normal limits  CBC - Abnormal; Notable for the following components:   Hemoglobin 17.9 (*)    HCT 52.3 (*)    MCV 101.9 (*)    MCH 34.9 (*)    All other components within normal limits  GROUP A STREP BY PCR  MONONUCLEOSIS SCREEN    EKG None  Radiology CT Soft Tissue Neck W Contrast  Result Date: 01/28/2020 CLINICAL DATA:  Provided history: Lymphadenopathy, neck; tonsil/adenoid disorder;  peritonsillar rule out. Difficulty swallowing and pain for 2-3 days. EXAM: CT NECK WITH CONTRAST TECHNIQUE: Multidetector CT imaging of the neck was performed using the standard protocol following the bolus administration of intravenous contrast. CONTRAST:  57mL OMNIPAQUE IOHEXOL 300 MG/ML  SOLN COMPARISON:  No pertinent prior exams available for comparison. FINDINGS: Pharynx and larynx: Poor dentition with multifocal periapical lucencies. Asymmetric prominence of the left palatine tonsil. Mucosal/submucosal edema within the left oropharynx. Mucosal/submucosal edema of the left aspect of the epiglottis and left aryepiglottic fold. Additionally, in the region of the left palatine tonsil there is a 5 mm hypodense peripherally enhancing  focus (series 6, image 44) (series 4, image 38). The oropharyngeal airway is patent. No retropharyngeal collection. Salivary glands: No inflammation, mass, or stone. Thyroid: Unremarkable. Lymph nodes: Left level 2 lymph nodes are subcentimeter in short axis, but asymmetrically prominent, likely reactive. Vascular: The major vascular structures of the neck are patent. Incidentally noted aberrant right subclavian artery. Calcified plaque within the visualized aortic arch and carotid bifurcations. Limited intracranial: No acute intracranial abnormality identified. Visualized orbits: Incompletely imaged. No mass or acute finding at the imaged levels. Mastoids and visualized paranasal sinuses: Mild ethmoid and maxillary sinus mucosal thickening. Small mucous retention cyst within the inferior right maxillary sinus. Skeleton: No acute bony abnormality or aggressive osseous lesion. 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 Cervical spondylosis with multilevel disc space narrowing, disc bulges and uncovertebral hypertrophy. Somewhat prominent ventral osteophyte at C5-C6. Upper chest: No consolidation within the imaged lung apices. IMPRESSION: Asymmetric prominence of the left  palatine tonsil with mucosal/submucosal edema of the left pharynx, left aspect of the epiglottis and left aryepiglottic fold. Associated 5 mm peripherally enhancing focus in the region of the left palatine tonsil. In the appropriate clinical setting, findings are compatible with tonsillitis/pharyngitis and supraglottitis with tiny left peritonsillar abscess. Close clinical follow-up recommended, with repeat imaging as warranted, to exclude alternative etiologies (i.e. mucosal neoplasm). The oropharyngeal airway is patent. Mild paranasal sinus disease as described. Congenital C2-C3 non-segmentation. Cervical spondylosis. Incidentally noted aberrant right subclavian artery, an anatomic variant. Aortic Atherosclerosis (ICD10-I70.0). Electronically Signed   By: Jackey Loge DO   On: 01/28/2020 13:21    Procedures Procedures (including critical care time)  Medications Ordered in ED Medications  dexamethasone (DECADRON) injection 8 mg (8 mg Intravenous Given 01/28/20 1215)  iohexol (OMNIPAQUE) 300 MG/ML solution 75 mL (75 mLs Intravenous Contrast Given 01/28/20 1241)  sodium chloride 0.9 % bolus 1,000 mL (0 mLs Intravenous Stopped 01/28/20 1540)  clindamycin (CLEOCIN) IVPB 300 mg (0 mg Intravenous Stopped 01/28/20 1522)  acetaminophen (TYLENOL) tablet 650 mg (650 mg Oral Given 01/28/20 1431)    ED Course  I have reviewed the triage vital signs and the nursing notes.  Pertinent labs & imaging results that were available during my care of the patient were reviewed by me and considered in my medical decision making (see chart for details).    MDM Rules/Calculators/A&P                         Miguel Anderson is a 64 y.o. male with past medical history of hypercholesteremia that presents emerge department today for sore throat. Patient is able to swallow, no drooling, handling secretions.  No true trismus, is able to drink a sip of water without any problems.  Will obtain CT at this time due to concerns  for peritonsillar abscess.  Decadron, Tylenol and fluids given at this time.  CT scan does show tonsillitis, supraglottitis, and concerns for a small peritonsillar abscess.  Will consult ENT at this time for further recommendation.  Spoke to Dr. Jenne Pane, ENT who recommends IV antibiotics and follow-up on Monday outpatient.  Due to very small nature of peritonsillar abscess, does not recommend any drainage at this time.  Upon reassessment, patient states that he does feel better, repeat evaluation without any drooling, no trismus.  Patient is able to handle secretions.  No respiratory distress,Pain under control, steroids and IV antibiotics given, patient to be discharged at this time with follow-up with ENT in outpatient antibiotics.  Doubt need  for further emergent work up at this time. I explained the diagnosis and have given explicit precautions to return to the ER including for any other new or worsening symptoms. The patient understands and accepts the medical plan as it's been dictated and I have answered their questions. Discharge instructions concerning home care and prescriptions have been given. The patient is STABLE and is discharged to home in good condition.  I discussed this case with my attending physician, Dr. Bernette MayersSheldon who cosigned this note including patient's presenting symptoms, physical exam, and planned diagnostics and interventions. Attending physician stated agreement with plan or made changes to plan which were implemented.    Final Clinical Impression(s) / ED Diagnoses Final diagnoses:  Tonsillitis  Peritonsillar abscess    Rx / DC Orders ED Discharge Orders         Ordered    clindamycin (CLEOCIN) 150 MG capsule  3 times daily        01/28/20 1609           Farrel Gordonatel, Aamori Mcmasters, PA-C 01/28/20 1615    Pollyann SavoySheldon, Charles B, MD 01/29/20 [phone]

## 2020-01-28 NOTE — ED Notes (Signed)
Pt to CT at this time.

## 2020-01-28 NOTE — Discharge Instructions (Signed)
You are seen today for tonsillitis and peritonsillar abscess, want you to follow-up with Dr. Jenne Pane on Monday please call their office.  Please take the antibiotics as directed, stay hydrated, you can take Tylenol as directed on the bottle for pain.  Please use the attached instructions.  If you feel like you cannot breathe or swallow please come back to the emergency department.  If you have any new or worsening concerning symptoms please come back to the emergency department.

## 2020-01-28 NOTE — Social Work (Signed)
[facility] notification note done [phone]

## 2020-09-28 ENCOUNTER — Ambulatory Visit
Admission: EM | Admit: 2020-09-28 | Discharge: 2020-09-28 | Disposition: A | Discharge status: Home / Self Care | Payer: No Typology Code available for payment source | Attending: Family Medicine | Admitting: Family Medicine

## 2020-09-28 DIAGNOSIS — M5416 Radiculopathy, lumbar region: Secondary | ICD-10-CM | POA: Diagnosis not present

## 2020-09-28 MED ORDER — TIZANIDINE HCL 4 MG PO TABS: 4.0000 mg | ORAL_TABLET | Freq: Every day | ORAL | 0 refills | Status: DC

## 2020-09-28 MED ORDER — PREDNISONE 20 MG PO TABS: 40.0000 mg | ORAL_TABLET | Freq: Every day | ORAL | 0 refills | Status: AC

## 2020-09-28 MED ORDER — KETOROLAC TROMETHAMINE 15 MG/ML IJ SOLN
15.0000 mg | Freq: Once | INTRAMUSCULAR | Status: AC
  Administered 2020-09-28: 15 mg via INTRAMUSCULAR

## 2020-09-28 MED ORDER — DEXAMETHASONE SODIUM PHOSPHATE 10 MG/ML IJ SOLN
10.0000 mg | Freq: Once | INTRAMUSCULAR | Status: AC
  Administered 2020-09-28: 10 mg via INTRAMUSCULAR

## 2020-09-28 NOTE — Discharge Instructions (Addendum)
Start prednisone tomorrow. Take Tizanidine at bedtime. Avoid taking during the day as medication causes drowsiness. [facility] with [facility] as needed.

## 2020-09-28 NOTE — ED Provider Notes (Signed)
[facility]    CSN: [id number] Arrival date & time: 09/28/20  1248      History   Chief Complaint Chief Complaint  Patient presents with   Back Pain    HPI Miguel Anderson is a 65 y.o. male.   HPI Patient presents today for evaluation of back pain with sciatica. Current pain has been present for approximately for 4 days No known injury. Reports prior flares comes and go. He is followed by the [facility] for back pain and has an active prescription for tramadol reports needs to requesr a refill Past Medical History:  Diagnosis Date   Hypercholesterolemia     Patient Active Problem List   Diagnosis Date Noted   Palpitations 03/27/2013   Tobacco use disorder 03/27/2013   Chest pain 03/26/2013    History reviewed. No pertinent surgical history.     Home Medications    Prior to Admission medications   Medication Sig Start Date End Date Taking? Authorizing Provider  predniSONE (DELTASONE) 20 MG tablet Take 2 tablets (40 mg total) by mouth daily with breakfast for 5 days. 09/29/20 10/04/20 Yes Bing Neighbors, FNP  tiZANidine (ZANAFLEX) 4 MG tablet Take 1 tablet (4 mg total) by mouth at bedtime. 09/28/20  Yes Bing Neighbors, FNP  acetaminophen (TYLENOL) 500 MG tablet Take 500 mg by mouth every 6 (six) hours as needed for mild pain.    [provider]  Aspirin-Caffeine (BAYER BACK & BODY PO) Take 1 tablet by mouth 2 (two) times daily.    [provider]    Family History History reviewed. No pertinent family history.  Social History Social History   Tobacco Use   Smoking status: Every Day    Packs/day: 1.00    Types: Cigarettes   Smokeless tobacco: Never  Substance Use Topics   Alcohol use: Yes    Comment: occ on weekends   Drug use: No     Allergies   Patient has no known allergies.   Review of Systems Review of Systems Pertinent negatives listed in HPI   Physical Exam Triage Vital Signs ED Triage Vitals  Enc Vitals  Group     BP 09/28/20 1308 (!) 184/95     Pulse Rate 09/28/20 1308 82     Resp 09/28/20 1308 18     Temp 09/28/20 1308 98.2 F (36.8 C)     Temp src --      SpO2 09/28/20 1308 95 %     Weight --      Height --      Head Circumference --      Peak Flow --      Pain Score 09/28/20 1306 5     Pain Loc --      Pain Edu? --      Excl. in GC? --    No data found.  Updated Vital Signs BP (!) 184/95   Pulse 82   Temp 98.2 F (36.8 C)   Resp 18   SpO2 95%   Visual Acuity Right Eye Distance:   Left Eye Distance:   Bilateral Distance:    Right Eye Near:   Left Eye Near:    Bilateral Near:     Physical Exam General appearance: Alert, well developed, well nourished, cooperative and  no distress Head: Normocephalic, without obvious abnormality, atraumatic Respiratory: Respirations even and unlabored, normal respiratory rate Heart:Rate and rhythm normal.  Back:  No gross deformities, buldge, spinous process aligned  Skin: Skin  color, texture, turgor normal. No rashes seen  Psych: Appropriate mood and affect. Neurologic: GCS 15, antalgic gait, no abnormal focal deficits  UC Treatments / Results  Labs (all labs ordered are listed, but only abnormal results are displayed) Labs Reviewed - No data to display  EKG   Radiology No results found.  Procedures Procedures (including critical care time)  Medications Ordered in UC Medications  ketorolac (TORADOL) 15 MG/ML injection 15 mg (has no administration in time range)  dexamethasone (DECADRON) injection 10 mg (has no administration in time range)    Initial Impression / Assessment and Plan / UC Course  I have reviewed the triage vital signs and the nursing notes.  Pertinent labs & imaging results that were available during my care of the patient were reviewed by me and considered in my medical decision making (see chart for details).    Acute lumbar radiculopathy  Treatment here in clinic with Toradol and Decadron   Outpatient management Zanaflex and prednisone RTC as needed. [facility]   Final Clinical Impressions(s) / UC Diagnoses   Final diagnoses:  Acute lumbar radiculopathy   Discharge Instructions   None    ED Prescriptions     Medication Sig Dispense Auth. Provider   tiZANidine (ZANAFLEX) 4 MG tablet Take 1 tablet (4 mg total) by mouth at bedtime. 30 tablet Bing Neighbors, FNP   predniSONE (DELTASONE) 20 MG tablet Take 2 tablets (40 mg total) by mouth daily with breakfast for 5 days. 10 tablet Bing Neighbors, FNP      PDMP not reviewed this encounter.   Bing Neighbors, FNP 09/28/20 1714

## 2020-09-28 NOTE — ED Triage Notes (Signed)
Pt presents with low back pain that began yesterday, denies injury

## 2020-10-25 ENCOUNTER — Other Ambulatory Visit: Payer: Self-pay | Admitting: Family Medicine

## 2020-12-21 ENCOUNTER — Emergency Department (HOSPITAL_COMMUNITY): Payer: No Typology Code available for payment source

## 2020-12-21 ENCOUNTER — Inpatient Hospital Stay (HOSPITAL_COMMUNITY)
Admission: EM | Admit: 2020-12-21 | Discharge: 2020-12-24 | DRG: 184 | Disposition: A | Discharge status: Home / Self Care | Payer: No Typology Code available for payment source | Attending: General Surgery | Admitting: General Surgery

## 2020-12-21 ENCOUNTER — Encounter (HOSPITAL_COMMUNITY): Payer: Self-pay | Admitting: *Deleted

## 2020-12-21 ENCOUNTER — Other Ambulatory Visit: Payer: Self-pay

## 2020-12-21 DIAGNOSIS — Y92002 Bathroom of unspecified non-institutional (private) residence single-family (private) house as the place of occurrence of the external cause: Secondary | ICD-10-CM | POA: Diagnosis not present

## 2020-12-21 DIAGNOSIS — F17210 Nicotine dependence, cigarettes, uncomplicated: Secondary | ICD-10-CM | POA: Diagnosis present

## 2020-12-21 DIAGNOSIS — Z20822 Contact with and (suspected) exposure to covid-19: Secondary | ICD-10-CM | POA: Diagnosis present

## 2020-12-21 DIAGNOSIS — S2243XA Multiple fractures of ribs, bilateral, initial encounter for closed fracture: Secondary | ICD-10-CM | POA: Diagnosis present

## 2020-12-21 DIAGNOSIS — M545 Low back pain, unspecified: Secondary | ICD-10-CM | POA: Diagnosis present

## 2020-12-21 DIAGNOSIS — Z79899 Other long term (current) drug therapy: Secondary | ICD-10-CM

## 2020-12-21 DIAGNOSIS — K5900 Constipation, unspecified: Secondary | ICD-10-CM | POA: Diagnosis present

## 2020-12-21 DIAGNOSIS — W19XXXA Unspecified fall, initial encounter: Secondary | ICD-10-CM | POA: Diagnosis present

## 2020-12-21 DIAGNOSIS — E7800 Pure hypercholesterolemia, unspecified: Secondary | ICD-10-CM | POA: Diagnosis present

## 2020-12-21 DIAGNOSIS — S2249XA Multiple fractures of ribs, unspecified side, initial encounter for closed fracture: Secondary | ICD-10-CM | POA: Diagnosis present

## 2020-12-21 DIAGNOSIS — J9811 Atelectasis: Secondary | ICD-10-CM | POA: Diagnosis present

## 2020-12-21 DIAGNOSIS — M546 Pain in thoracic spine: Secondary | ICD-10-CM | POA: Diagnosis not present

## 2020-12-21 DIAGNOSIS — Z7982 Long term (current) use of aspirin: Secondary | ICD-10-CM

## 2020-12-21 DIAGNOSIS — G8929 Other chronic pain: Secondary | ICD-10-CM | POA: Diagnosis present

## 2020-12-21 DIAGNOSIS — Z23 Encounter for immunization: Secondary | ICD-10-CM

## 2020-12-21 LAB — COMPREHENSIVE METABOLIC PANEL
ALT: 25 U/L (ref 0–44)
AST: 35 U/L (ref 15–41)
Albumin: 4.2 g/dL (ref 3.5–5.0)
Alkaline Phosphatase: 122 U/L (ref 38–126)
Anion gap: 10 (ref 5–15)
BUN: 8 mg/dL (ref 8–23)
CO2: 30 mmol/L (ref 22–32)
Calcium: 8.8 mg/dL — ABNORMAL LOW (ref 8.9–10.3)
Chloride: 100 mmol/L (ref 98–111)
Creatinine, Ser: 0.66 mg/dL (ref 0.61–1.24)
GFR, Estimated: >60 mL/min (ref >60)
Glucose, Bld: 108 mg/dL — ABNORMAL HIGH (ref 70–99)
Potassium: 3.3 mmol/L — ABNORMAL LOW (ref 3.5–5.1)
Sodium: 140 mmol/L (ref 135–145)
Total Bilirubin: 1.1 mg/dL (ref 0.3–1.2)
Total Protein: 7.9 g/dL (ref 6.5–8.1)

## 2020-12-21 LAB — CBC WITH DIFFERENTIAL/PLATELET
Abs Immature Granulocytes: 0.02 10*3/uL (ref 0.00–0.07)
Basophils Absolute: 0.1 10*3/uL (ref 0.0–0.1)
Basophils Relative: 0 %
Eosinophils Absolute: 0 10*3/uL (ref 0.0–0.5)
Eosinophils Relative: 0 %
HCT: 55.3 % — ABNORMAL HIGH (ref 39.0–52.0)
Hemoglobin: 18.6 g/dL — ABNORMAL HIGH (ref 13.0–17.0)
Immature Granulocytes: 0 %
Lymphocytes Relative: 12 %
Lymphs Abs: 1.4 10*3/uL (ref 0.7–4.0)
MCH: 34.6 pg — ABNORMAL HIGH (ref 26.0–34.0)
MCHC: 33.6 g/dL (ref 30.0–36.0)
MCV: 103 fL — ABNORMAL HIGH (ref 80.0–100.0)
Monocytes Absolute: 1 10*3/uL (ref 0.1–1.0)
Monocytes Relative: 9 %
Neutro Abs: 8.8 10*3/uL — ABNORMAL HIGH (ref 1.7–7.7)
Neutrophils Relative %: 79 %
Platelets: 190 10*3/uL (ref 150–400)
RBC: 5.37 MIL/uL (ref 4.22–5.81)
RDW: 12.6 % (ref 11.5–15.5)
WBC: 11.4 10*3/uL — ABNORMAL HIGH (ref 4.0–10.5)
nRBC: 0 % (ref 0.0–0.2)

## 2020-12-21 LAB — LIPASE, BLOOD: Lipase: 28 U/L (ref 11–51)

## 2020-12-21 MED ORDER — IOHEXOL 350 MG/ML SOLN
75.0000 mL | Freq: Once | INTRAVENOUS | Status: AC | PRN
  Administered 2020-12-21: 75 mL via INTRAVENOUS

## 2020-12-21 MED ORDER — TRAMADOL HCL 50 MG PO TABS
50.0000 mg | ORAL_TABLET | Freq: Once | ORAL | Status: AC
  Administered 2020-12-21: 50 mg via ORAL
  Filled 2020-12-21: qty 1

## 2020-12-21 MED ORDER — METHOCARBAMOL 500 MG PO TABS
1000.0000 mg | ORAL_TABLET | Freq: Once | ORAL | Status: AC
  Administered 2020-12-21: 1000 mg via ORAL
  Filled 2020-12-21: qty 2

## 2020-12-21 MED ORDER — KETOROLAC TROMETHAMINE 30 MG/ML IJ SOLN
15.0000 mg | Freq: Once | INTRAMUSCULAR | Status: AC
  Administered 2020-12-21: 15 mg via INTRAVENOUS
  Filled 2020-12-21: qty 1

## 2020-12-21 MED ORDER — MORPHINE SULFATE (PF) 4 MG/ML IV SOLN
4.0000 mg | Freq: Once | INTRAVENOUS | Status: AC
  Administered 2020-12-21: 4 mg via INTRAVENOUS
  Filled 2020-12-21: qty 1

## 2020-12-21 MED ORDER — OXYCODONE-ACETAMINOPHEN 5-325 MG PO TABS
1.0000 | ORAL_TABLET | Freq: Four times a day (QID) | ORAL | Status: DC | PRN
  Administered 2020-12-21: 2 via ORAL
  Administered 2020-12-22: 1 via ORAL
  Filled 2020-12-21: qty 2
  Filled 2020-12-21: qty 1

## 2020-12-21 MED ORDER — ACETAMINOPHEN 500 MG PO TABS
1000.0000 mg | ORAL_TABLET | Freq: Three times a day (TID) | ORAL | Status: DC
  Administered 2020-12-22: 1000 mg via ORAL
  Filled 2020-12-21 (×2): qty 2

## 2020-12-21 MED ORDER — KETOROLAC TROMETHAMINE 30 MG/ML IJ SOLN
15.0000 mg | Freq: Three times a day (TID) | INTRAMUSCULAR | Status: DC
  Administered 2020-12-21 – 2020-12-24 (×8): 15 mg via INTRAVENOUS
  Filled 2020-12-21 (×9): qty 1

## 2020-12-21 MED ORDER — METHOCARBAMOL 500 MG PO TABS
1000.0000 mg | ORAL_TABLET | Freq: Three times a day (TID) | ORAL | Status: DC | PRN
  Administered 2020-12-21: 1000 mg via ORAL
  Filled 2020-12-21 (×2): qty 2

## 2020-12-21 NOTE — ED Notes (Addendum)
Patient transported to CT 

## 2020-12-21 NOTE — ED Notes (Signed)
Tried to explain to patient it is not recommended to get up to the bathroom and best to use urinal.  Pt states they walked him to bathroom and wishes to do so.  Pt in pain with all movement

## 2020-12-21 NOTE — ED Provider Notes (Signed)
[facility] EMERGENCY DEPARTMENT Provider Note   CSN: [id number] Arrival date & time: 12/21/20  1240     History Chief Complaint  Patient presents with   Back Pain    Miguel Anderson is a 65 y.o. male presenting for evaluation of back pain.  Patient states he has chronic low back pain, however 3 to 4 days ago he developed pain in his right upper back.  He later fell, and a day or 2 afterwards he developed pain of his left upper back as well.  He reports pain is severe, feels like a spasm.  Worsens with certain movements.  He denies fevers, chills, cough, shortness of breath, anterior chest pain, nausea, vomiting abdominal pain, loss of bowel bladder control, urinary symptoms, normal bowel movements.  He took Advil, has not taken anything else for symptoms.  He does not take anything chronically for his low back.  No numbness or tingling in his arms.  HPI     Past Medical History:  Diagnosis Date   Hypercholesterolemia     Patient Active Problem List   Diagnosis Date Noted   Palpitations 03/27/2013   Tobacco use disorder 03/27/2013   Chest pain 03/26/2013    History reviewed. No pertinent surgical history.     No family history on file.  Social History   Tobacco Use   Smoking status: Every Day    Packs/day: 0.75    Types: Cigarettes   Smokeless tobacco: Never  Substance Use Topics   Alcohol use: Yes    Comment: occ on weekends   Drug use: No    Home Medications Prior to Admission medications   Medication Sig Start Date End Date Taking? Authorizing Provider  acetaminophen (TYLENOL) 500 MG tablet Take 500 mg by mouth every 6 (six) hours as needed for mild pain.    [provider]  Aspirin-Caffeine (BAYER BACK & BODY PO) Take 1 tablet by mouth 2 (two) times daily.    [provider]  tiZANidine (ZANAFLEX) 4 MG tablet Take 1 tablet (4 mg total) by mouth at bedtime. 09/28/20   Bing Neighbors, FNP    Allergies    Patient has no known  allergies.  Review of Systems   Review of Systems  Musculoskeletal:  Positive for back pain.  All other systems reviewed and are negative.  Physical Exam Updated Vital Signs BP (!) 189/116   Pulse 94   Temp 98.4 F (36.9 C) (Oral)   Resp 20   Ht 5\' 8"  (1.727 m)   Wt 90.7 kg   SpO2 97%   BMI 30.41 kg/m   Physical Exam Vitals and nursing note reviewed.  Constitutional:      General: He is not in acute distress.    Appearance: Normal appearance.  HENT:     Head: Normocephalic and atraumatic.  Eyes:     Conjunctiva/sclera: Conjunctivae normal.     Pupils: Pupils are equal, round, and reactive to light.  Cardiovascular:     Rate and Rhythm: Normal rate and regular rhythm.     Pulses: Normal pulses.  Pulmonary:     Effort: Pulmonary effort is normal. No respiratory distress.     Breath sounds: Normal breath sounds. No wheezing.     Comments: Speaking in full sentences.  Clear lung sounds in all fields. Abdominal:     General: There is no distension.     Palpations: Abdomen is soft.     Tenderness: There is no abdominal tenderness.  Musculoskeletal:  General: Normal range of motion.     Cervical back: Normal range of motion and neck supple.       Back:  Skin:    General: Skin is warm and dry.     Capillary Refill: Capillary refill takes less than 2 seconds.  Neurological:     Mental Status: He is alert and oriented to person, place, and time.  Psychiatric:        Mood and Affect: Mood and affect normal.        Speech: Speech normal.        Behavior: Behavior normal.    ED Results / Procedures / Treatments   Labs (all labs ordered are listed, but only abnormal results are displayed) Labs Reviewed  CBC WITH DIFFERENTIAL/PLATELET - Abnormal; Notable for the following components:      Result Value   WBC 11.4 (*)    Hemoglobin 18.6 (*)    HCT 55.3 (*)    MCV 103.0 (*)    MCH 34.6 (*)    Neutro Abs 8.8 (*)    All other components within normal limits   COMPREHENSIVE METABOLIC PANEL  LIPASE, BLOOD    EKG EKG Interpretation  Date/Time:  Monday December 21 2020 15:43:46 EST Ventricular Rate:  82 PR Interval:  152 QRS Duration: 100 QT Interval:  402 QTC Calculation: 469 R Axis:   -34 Text Interpretation: Normal sinus rhythm Left axis deviation Incomplete right bundle branch block Cannot rule out Inferior infarct , age undetermined Abnormal ECG Confirmed by Alona Bene [phone]) on 12/21/2020 3:54:36 PM  Radiology DG Chest 2 View  Result Date: 12/21/2020 CLINICAL DATA:  Back pain; recent fall EXAM: CHEST - 2 VIEW COMPARISON:  2015 FINDINGS: Elevation of left hemidiaphragm. Patchy density at the left lung base. No pleural effusion. Cardiomediastinal contours are within normal limits. Possibly acute lateral right rib fractures (likely sixth, seventh, and eighth). IMPRESSION: Elevation of left hemidiaphragm. Patchy atelectasis/consolidation at the left lung base. Possibly acute lateral right rib fractures. Electronically Signed   By: Guadlupe Spanish M.D.   On: 12/21/2020 15:58    Procedures Procedures   Medications Ordered in ED Medications  ketorolac (TORADOL) 30 MG/ML injection 15 mg (15 mg Intravenous Given 12/21/20 1535)  methocarbamol (ROBAXIN) tablet 1,000 mg (1,000 mg Oral Given 12/21/20 1528)  traMADol (ULTRAM) tablet 50 mg (50 mg Oral Given 12/21/20 1528)    ED Course  I have reviewed the triage vital signs and the nursing notes.  Pertinent labs & imaging results that were available during my care of the patient were reviewed by me and considered in my medical decision making (see chart for details).    MDM Rules/Calculators/A&P                           Patient presenting for evaluation of back pain.  On exam, patient appears nontoxic.  His pain is minimal with palpation, however worsens with specific movements.  No rash or signs of injury.  Pain began before he fell, as such, doubt this is trauma induced.  However due  to the abnormal location of his pain and severity, will obtain labs and chest x-ray.  Chest x-ray viewed and independently interpreted by me, shows abnormality of the right ribs.  No obvious infection.  Labs with a mild leukocytosis of 11, otherwise reassuring.  Will obtain CT due to abnormality of chest pain to ensure no PE, further injury, or pulmonary injury.  If  CT shows 4 rib fractures on the right, 3 of which are displaced.  Shows rib fractures 6 through 9 on the left, 6 through 8 are segmental and at risk for flail chest.  On reevaluation, patient's pain is uncontrolled with oral medication.  He continues to be splinting,, at high risk for pneumonia considering the number of fractures and his age.  Will call for admission.  Discussed with Dr. Henreitta Leber from general surgery at the [facility], who feels patient would be better served by the trauma service at [facility].  Discussed with Dr. Andrey Campanile from trauma at [facility] who recommends scheduled Tylenol, as needed narcotic, as needed muscle relaxer, and low-dose scheduled Toradol. Incentive spirometry ordered.   Final Clinical Impression(s) / ED Diagnoses Final diagnoses:  Closed fracture of multiple ribs of both sides, initial encounter  Fall, initial encounter    Rx / DC Orders ED Discharge Orders     None        Alveria Apley, PA-C 12/21/20 1853    Maia Plan, MD 12/22/20 1049

## 2020-12-21 NOTE — ED Notes (Signed)
Patient transported to X-ray 

## 2020-12-21 NOTE — Progress Notes (Addendum)
Explained Incentive Spirometer to patient.  Patient was able to do x10.  Left with patient.  Patient states that they are supposed to transport him to [facility], so I informed patient to make sure IS goes with him.  Patient gave good effort and seems to understand how this therapy will help him.

## 2020-12-21 NOTE — ED Triage Notes (Signed)
Pt c/o mid back pain x 4-5 days. Pt reports he fell in the bathroom a few days ago but the pain didn't start until days later.

## 2020-12-22 DIAGNOSIS — S2249XA Multiple fractures of ribs, unspecified side, initial encounter for closed fracture: Secondary | ICD-10-CM | POA: Diagnosis present

## 2020-12-22 LAB — CBC
HCT: 48.2 % (ref 39.0–52.0)
Hemoglobin: 16.7 g/dL (ref 13.0–17.0)
MCH: 34.9 pg — ABNORMAL HIGH (ref 26.0–34.0)
MCHC: 34.6 g/dL (ref 30.0–36.0)
MCV: 100.6 fL — ABNORMAL HIGH (ref 80.0–100.0)
Platelets: 153 10*3/uL (ref 150–400)
RBC: 4.79 MIL/uL (ref 4.22–5.81)
RDW: 12 % (ref 11.5–15.5)
WBC: 7.6 10*3/uL (ref 4.0–10.5)
nRBC: 0 % (ref 0.0–0.2)

## 2020-12-22 LAB — RESP PANEL BY RT-PCR (FLU A&B, COVID) ARPGX2
Influenza A by PCR: NEGATIVE
Influenza B by PCR: NEGATIVE
SARS Coronavirus 2 by RT PCR: NEGATIVE

## 2020-12-22 LAB — CREATININE, SERUM
Creatinine, Ser: 0.83 mg/dL (ref 0.61–1.24)
GFR, Estimated: >60 mL/min (ref >60)

## 2020-12-22 MED ORDER — PSYLLIUM 95 % PO PACK
1.0000 | PACK | Freq: Every day | ORAL | Status: DC
  Administered 2020-12-22 – 2020-12-24 (×3): 1 via ORAL
  Filled 2020-12-22 (×3): qty 1

## 2020-12-22 MED ORDER — ACETAMINOPHEN 500 MG PO TABS
1000.0000 mg | ORAL_TABLET | Freq: Four times a day (QID) | ORAL | Status: DC
  Administered 2020-12-22 – 2020-12-24 (×8): 1000 mg via ORAL
  Filled 2020-12-22 (×9): qty 2

## 2020-12-22 MED ORDER — MORPHINE SULFATE (PF) 2 MG/ML IV SOLN
1.0000 mg | INTRAVENOUS | Status: DC | PRN
Start: 2020-12-22 — End: 2020-12-24
  Filled 2020-12-22: qty 2

## 2020-12-22 MED ORDER — SIMETHICONE 80 MG PO CHEW
80.0000 mg | CHEWABLE_TABLET | Freq: Four times a day (QID) | ORAL | Status: DC | PRN
  Administered 2020-12-23: 80 mg via ORAL
  Filled 2020-12-22: qty 1

## 2020-12-22 MED ORDER — OXYCODONE HCL 5 MG PO TABS
5.0000 mg | ORAL_TABLET | ORAL | Status: DC | PRN
Start: 2020-12-22 — End: 2020-12-24
  Administered 2020-12-22 – 2020-12-24 (×9): 10 mg via ORAL
  Filled 2020-12-22 (×9): qty 2

## 2020-12-22 MED ORDER — METHOCARBAMOL 500 MG PO TABS
1000.0000 mg | ORAL_TABLET | Freq: Three times a day (TID) | ORAL | Status: DC
  Administered 2020-12-22 – 2020-12-24 (×7): 1000 mg via ORAL
  Filled 2020-12-22 (×7): qty 2

## 2020-12-22 MED ORDER — ONDANSETRON HCL 4 MG/2ML IJ SOLN: 4.0000 mg | Freq: Four times a day (QID) | INTRAMUSCULAR | Status: DC | PRN

## 2020-12-22 MED ORDER — DOCUSATE SODIUM 100 MG PO CAPS
100.0000 mg | ORAL_CAPSULE | Freq: Two times a day (BID) | ORAL | Status: DC
  Administered 2020-12-23 – 2020-12-24 (×4): 100 mg via ORAL
  Filled 2020-12-22 (×4): qty 1

## 2020-12-22 MED ORDER — ENOXAPARIN SODIUM 30 MG/0.3ML IJ SOSY
30.0000 mg | PREFILLED_SYRINGE | Freq: Two times a day (BID) | INTRAMUSCULAR | Status: DC
  Administered 2020-12-23 – 2020-12-24 (×3): 30 mg via SUBCUTANEOUS
  Filled 2020-12-22 (×3): qty 0.3

## 2020-12-22 NOTE — ED Notes (Signed)
Pt complains of severe pain with moving to transport stretcher.

## 2020-12-22 NOTE — ED Notes (Signed)
Ambulatory to restroom to brush teeth and freshen up

## 2020-12-22 NOTE — H&P (Signed)
Miguel Anderson 1955/08/14  [id number].    Chief Complaint/Reason for Consult: Fall with B fib fractures  HPI:  This is a 65 yo white male with a history of hypercholesterolemia and chronic low back pain who fell several days ago and developed bilateral upper back pain that is severe in nature.  Movement is extremely difficult.  He denies CP, SOB, fevers, chills, N/V.  He took some Ibuprofen with no relief.  He presented to [facility] for evaluation and was found to have B rib fractures, multiple segments on the left side. He was also noted to have some left airspace disease concerning for possible developing PNA.  Given his need for pain control and aggressive pulmonary toileting, he was transferred to [facility] to the trauma service for evaluation.  ROS: ROS: Please see HPI, otherwise all other systems have been reviewed and are negative.  No family history on file.  Past Medical History:  Diagnosis Date   Hypercholesterolemia     History reviewed. No pertinent surgical history.  Social History:  reports that he has been smoking cigarettes. He has been smoking an average of .75 packs per day. He has never used smokeless tobacco. He reports current alcohol use. He reports that he does not use drugs.  Allergies: No Known Allergies  (Not in a hospital admission)    Physical Exam: Blood pressure (!) 171/89, pulse (!) 59, temperature 98.7 F (37.1 C), temperature source Oral, resp. rate 18, height 5\' 8"  (1.727 m), weight 90.7 kg, SpO2 98 %. General: pleasant, WD, WN white male who is laying in bed in NAD HEENT: head is normocephalic, atraumatic.  Sclera are noninjected.  PERRL.  Ears and nose without any masses or lesions.  Mouth is pink and moist Heart: regular, rate, and rhythm.  Normal s1,s2. No obvious murmurs, gallops, or rubs noted.  Palpable radial and pedal pulses bilaterally Lungs: CTAB, no wheezes, rhonchi, or rales noted.  Respiratory effort nonlabored Abd: soft, NT, ND, +BS, no  masses, hernias, or organomegaly MS: all 4 extremities are symmetrical with no cyanosis, clubbing, or edema. Skin: warm and dry with no masses, lesions, or rashes Neuro: Cranial nerves 2-12 grossly intact, sensation is normal throughout Psych: A&Ox3 with an appropriate affect.   Results for orders placed or performed during the hospital encounter of 12/21/20 (from the past 48 hour(s))  CBC with Differential     Status: Abnormal   Collection Time: 12/21/20  3:37 PM  Result Value Ref Range   WBC 11.4 (H) 4.0 - 10.5 K/uL   RBC 5.37 4.22 - 5.81 MIL/uL   Hemoglobin 18.6 (H) 13.0 - 17.0 g/dL   HCT 12/23/20 (H) 14.4 - 81.8 %   MCV 103.0 (H) 80.0 - 100.0 fL   MCH 34.6 (H) 26.0 - 34.0 pg   MCHC 33.6 30.0 - 36.0 g/dL   RDW 56.3 14.9 - 70.2 %   Platelets 190 150 - 400 K/uL   nRBC 0.0 0.0 - 0.2 %   Neutrophils Relative % 79 %   Neutro Abs 8.8 (H) 1.7 - 7.7 K/uL   Lymphocytes Relative 12 %   Lymphs Abs 1.4 0.7 - 4.0 K/uL   Monocytes Relative 9 %   Monocytes Absolute 1.0 0.1 - 1.0 K/uL   Eosinophils Relative 0 %   Eosinophils Absolute 0.0 0.0 - 0.5 K/uL   Basophils Relative 0 %   Basophils Absolute 0.1 0.0 - 0.1 K/uL   Immature Granulocytes 0 %   Abs Immature Granulocytes  0.02 0.00 - 0.07 K/uL    Comment: Performed at [facility], [address], [place], [place] [address]  Comprehensive metabolic panel     Status: Abnormal   Collection Time: 12/21/20  3:37 PM  Result Value Ref Range   Sodium 140 135 - 145 mmol/L   Potassium 3.3 (L) 3.5 - 5.1 mmol/L   Chloride 100 98 - 111 mmol/L   CO2 30 22 - 32 mmol/L   Glucose, Bld 108 (H) 70 - 99 mg/dL    Comment: Glucose reference range applies only to samples taken after fasting for at least 8 hours.   BUN 8 8 - 23 mg/dL   Creatinine, Ser 9.14 0.61 - 1.24 mg/dL   Calcium 8.8 (L) 8.9 - 10.3 mg/dL   Total Protein 7.9 6.5 - 8.1 g/dL   Albumin 4.2 3.5 - 5.0 g/dL   AST 35 15 - 41 U/L   ALT 25 0 - 44 U/L   Alkaline Phosphatase 122 38 - 126 U/L    Total Bilirubin 1.1 0.3 - 1.2 mg/dL   GFR, Estimated >78 >29 mL/min    Comment: (NOTE) Calculated using the CKD-EPI Creatinine Equation (2021)    Anion gap 10 5 - 15    Comment: Performed at [facility], [address], [place], [place] [address]  Lipase, blood     Status: None   Collection Time: 12/21/20  3:37 PM  Result Value Ref Range   Lipase 28 11 - 51 U/L    Comment: Performed at [facility], [address] [place], [place] [address]  Resp Panel by RT-PCR (Flu A&B, Covid) Nasopharyngeal Swab     Status: None   Collection Time: 12/22/20  4:14 AM   Specimen: Nasopharyngeal Swab; Nasopharyngeal(NP) swabs in vial transport medium  Result Value Ref Range   SARS Coronavirus 2 by RT PCR NEGATIVE NEGATIVE    Comment: (NOTE) SARS-CoV-2 target nucleic acids are NOT DETECTED.  The SARS-CoV-2 RNA is generally detectable in upper respiratory specimens during the acute phase of infection. The lowest concentration of SARS-CoV-2 viral copies this assay can detect is 138 copies/mL. A negative result does not preclude SARS-Cov-2 infection and should not be used as the sole basis for treatment or other patient management decisions. A negative result may occur with  improper specimen collection/handling, submission of specimen other than nasopharyngeal swab, presence of viral mutation(s) within the areas targeted by this assay, and inadequate number of viral copies(<138 copies/mL). A negative result must be combined with clinical observations, patient history, and epidemiological information. The expected result is Negative.  Fact Sheet for Patients:  BloggerCourse.com  Fact Sheet for Healthcare Providers:  SeriousBroker.it  This test is no t yet approved or cleared by the [place] FDA and  has been authorized for detection and/or diagnosis of SARS-CoV-2 by FDA under an Emergency Use Authorization (EUA). This EUA will remain  in  effect (meaning this test can be used) for the duration of the COVID-19 declaration under Section 564(b)(1) of the Act, 21 U.S.C.section 360bbb-3(b)(1), unless the authorization is terminated  or revoked sooner.       Influenza A by PCR NEGATIVE NEGATIVE   Influenza B by PCR NEGATIVE NEGATIVE    Comment: (NOTE) The Xpert Xpress SARS-CoV-2/FLU/RSV plus assay is intended as an aid in the diagnosis of influenza from Nasopharyngeal swab specimens and should not be used as a sole basis for treatment. Nasal washings and aspirates are unacceptable for Xpert Xpress SARS-CoV-2/FLU/RSV testing.  Fact Sheet  for Patients: BloggerCourse.com  Fact Sheet for Healthcare Providers: SeriousBroker.it  This test is not yet approved or cleared by the [place] FDA and has been authorized for detection and/or diagnosis of SARS-CoV-2 by FDA under an Emergency Use Authorization (EUA). This EUA will remain in effect (meaning this test can be used) for the duration of the COVID-19 declaration under Section 564(b)(1) of the Act, 21 U.S.C. section 360bbb-3(b)(1), unless the authorization is terminated or revoked.  Performed at [facility], [address], [place], [place] [address]    DG Chest 2 View  Result Date: 12/21/2020 CLINICAL DATA:  Back pain; recent fall EXAM: CHEST - 2 VIEW COMPARISON:  2015 FINDINGS: Elevation of left hemidiaphragm. Patchy density at the left lung base. No pleural effusion. Cardiomediastinal contours are within normal limits. Possibly acute lateral right rib fractures (likely sixth, seventh, and eighth). IMPRESSION: Elevation of left hemidiaphragm. Patchy atelectasis/consolidation at the left lung base. Possibly acute lateral right rib fractures. Electronically Signed   By: Guadlupe Spanish M.D.   On: 12/21/2020 15:58   CT Angio Chest PE W and/or Wo Contrast  Result Date: 12/21/2020 CLINICAL DATA:  PE suspected, high prob Mid  back pain for 4-5 days.  Fall in the bathroom. EXAM: CT ANGIOGRAPHY CHEST WITH CONTRAST TECHNIQUE: Multidetector CT imaging of the chest was performed using the standard protocol during bolus administration of intravenous contrast. Multiplanar CT image reconstructions and MIPs were obtained to evaluate the vascular anatomy. CONTRAST:  27mL OMNIPAQUE IOHEXOL 350 MG/ML SOLN COMPARISON:  Chest radiograph earlier today. FINDINGS: Cardiovascular: There are no filling defects within the pulmonary arteries to suggest pulmonary embolus. Aortic atherosclerosis without aneurysm or acute aortic abnormalities. 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 The heart is normal in size. There are coronary artery calcifications. No pericardial effusion. Mediastinum/Nodes: No adenopathy. No mediastinal hematoma. Decompressed esophagus. No thyroid nodule. Lungs/Pleura: Elevation of left hemidiaphragm. There is adjacent airspace disease in the left lower lobe and lingula with few air bronchograms. Trace left pleural thickening/small effusion. Minor subsegmental atelectasis in the dependent right lower lobe. There is no right pleural effusion. No pneumothorax. The trachea and central bronchi are patent. No findings of pulmonary edema. Upper Abdomen: Possible hepatic steatosis. There are 2 subcentimeter low-density lesions in the left lobe of the liver that are too small to characterize, likely small cysts. Splenules are noted at the inferior splenic hilum. No free fluid or acute findings in the upper abdomen. Musculoskeletal: Minimally displaced posterolateral right fifth and sixth rib fractures. There is a nondisplaced posterior right tenth rib fracture. Left rib fractures, with the sixth through eighth rib fractures being segmental. Fractures of the left sixth through ninth ribs at the costovertebral junctions, as well as fractures of posterior sixth, seventh, and eighth ribs. The thoracic spine is  partially ankylosed with flowing anterior osteophytes throughout. No evidence is acute thoracic spine fracture or abnormal alignment of the ankylosed spine. No acute fracture of the sternum, included clavicles or shoulder girdles. Review of the MIP images confirms the above findings. IMPRESSION: 1. No pulmonary embolus. 2. Bilateral rib fractures. Right fifth and sixth and tenth rib fractures are minimally displaced. Left rib fractures 6 through 9, with sixth through eighth fractures being segmental, raising possibility of flail chest segment. 3. No pneumothorax. Trace left pleural thickening/small effusion with left basilar airspace disease in the lingula and left lower lobe related to elevated hemidiaphragm. 4. Ankylosis of the thoracic spine. No evidence of acute thoracic spine fracture  or abnormal alignment of the ankylosed spine. 5. Aortic atherosclerosis and coronary artery calcifications. 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 Aortic Atherosclerosis (ICD10-I70.0). Electronically Signed   By: Narda Rutherford M.D.   On: 12/21/2020 17:16      Assessment/Plan Fall R 5,6,10 rib fxs - multimodal pain control, IS, mobilization L 6-9, 6-8 segmental rib fxs with possible early PNA - multimodal pain control, IS, pulm toilet, mobilization.  Will monitor for clinical symptoms of PNA and treat as indicated. Chronic low back pain Hypercholesterolemia  FEN - regular VTE - lovenox ID - none Admit - observation  Quentin Ore, MD  [facility] 12/22/2020, 11:28 AM Please see Amion for pager number during day hours 7:00am-4:30pm or 7:00am -11:30am on weekends

## 2020-12-22 NOTE — ED Notes (Signed)
IS performed by pt.  Pt instructed to use IS q1h while awake.

## 2020-12-23 ENCOUNTER — Inpatient Hospital Stay (HOSPITAL_COMMUNITY): Payer: No Typology Code available for payment source

## 2020-12-23 LAB — HIV ANTIBODY (ROUTINE TESTING W REFLEX): HIV Screen 4th Generation wRfx: NONREACTIVE

## 2020-12-23 MED ORDER — GLYCERIN (LAXATIVE) 2.1 G RE SUPP
1.0000 | Freq: Once | RECTAL | Status: DC
  Filled 2020-12-23: qty 1

## 2020-12-23 MED ORDER — SENNA 8.6 MG PO TABS
2.0000 | ORAL_TABLET | Freq: Every day | ORAL | Status: DC
  Administered 2020-12-23 – 2020-12-24 (×2): 17.2 mg via ORAL
  Filled 2020-12-23 (×2): qty 2

## 2020-12-23 MED ORDER — LIDOCAINE 5 % EX PTCH
2.0000 | MEDICATED_PATCH | CUTANEOUS | Status: DC
  Administered 2020-12-23 – 2020-12-24 (×2): 2 via TRANSDERMAL
  Filled 2020-12-23 (×2): qty 2

## 2020-12-23 MED ORDER — BISACODYL 10 MG RE SUPP
10.0000 mg | Freq: Once | RECTAL | Status: AC
  Administered 2020-12-23: 10 mg via RECTAL
  Filled 2020-12-23: qty 1

## 2020-12-23 MED ORDER — POLYETHYLENE GLYCOL 3350 17 G PO PACK
17.0000 g | PACK | Freq: Two times a day (BID) | ORAL | Status: DC
  Administered 2020-12-23 – 2020-12-24 (×3): 17 g via ORAL
  Filled 2020-12-23 (×3): qty 1

## 2020-12-23 NOTE — Care Management (Signed)
[facility] admission notification has been completed by admitting.    Notification ID is:  [id number]   Quintella Baton, RN, BSN  Trauma/Neuro ICU Case Manager [phone]

## 2020-12-23 NOTE — Evaluation (Signed)
Physical Therapy Evaluation Patient Details Name: Miguel Anderson MRN: [id number] DOB: November 02, 1955 Today's Date: 12/23/2020  History of Present Illness  Pt is a 65 y.o. male admitted 11/14 following a fall at home. Xrays revealed R 5,6,10 rib fxs and L 6-9, 6-8 segmental rib fxs. PMH: hypercholesterolemia and chronic low back pain   Clinical Impression  Pt admitted with above diagnosis. PTA pt active and independent. He had an accidental fall in the shower sustaining bilat rib fxs. Pain is significantly hindering his mobility. On eval he demonstrates modified independence with bed mobility and transfers. Supervision provided for ambulation 50' without AD. Pt reports he has been ambulating to/from bathroom independently. Pt will benefit from skilled PT to increase their independence and safety with mobility to allow discharge home. PT to follow acutely to further assess/improve gait distance and stairs. He has 4 steps with rails to enter his apartment. Pt reports he may be able to stay with his son at d/c, if needed. No follow up PT services indicated.           Recommendations for follow up therapy are one component of a multi-disciplinary discharge planning process, led by the attending physician.  Recommendations may be updated based on patient status, additional functional criteria and insurance authorization.  Follow Up Recommendations No PT follow up    Assistance Recommended at Discharge Intermittent Supervision/Assistance  Functional Status Assessment Patient has had a recent decline in their functional status and demonstrates the ability to make significant improvements in function in a reasonable and predictable amount of time.  Equipment Recommendations  None recommended by PT    Recommendations for Other Services       Precautions / Restrictions Precautions Precautions: Fall      Mobility  Bed Mobility Overal bed mobility: Modified Independent             General  bed mobility comments: +rail, increased time due to pain, HOB elevated    Transfers Overall transfer level: Modified independent Equipment used: None               General transfer comment: increased time due to pain    Ambulation/Gait Ambulation/Gait assistance: Supervision Gait Distance (Feet): 50 Feet Assistive device: None Gait Pattern/deviations: Step-through pattern;Decreased stride length Gait velocity: decreased Gait velocity interpretation: <1.8 ft/sec, indicate of risk for recurrent falls   General Gait Details: reaching for support of bed/counter in room when sharp episode of pain occured. No LOB noted. No knee buckling. No physical assist.  Stairs            Wheelchair Mobility    Modified Rankin (Stroke Patients Only)       Balance Overall balance assessment: No apparent balance deficits (not formally assessed)                                           Pertinent Vitals/Pain Pain Assessment: Faces Faces Pain Scale: Hurts whole lot Pain Location: fx sites with mobility Pain Descriptors / Indicators: Stabbing;Spasm;Sharp;Grimacing;Guarding;Moaning Pain Intervention(s): Limited activity within patient's tolerance;Repositioned;Monitored during session    Home Living Family/patient expects to be discharged to:: Private residence Living Arrangements: Other relatives (brother) Available Help at Discharge:  (son and DIL) Type of Home: Apartment Home Access: Stairs to enter Entrance Stairs-Rails: Doctor, general practice of Steps: 4   Home Layout: Laundry or work area in basement;Able to live on main level  with bedroom/bathroom Home Equipment: None      Prior Function Prior Level of Function : Independent/Modified Independent;Driving;Working/employed                     Hand Dominance        Extremity/Trunk Assessment   Upper Extremity Assessment Upper Extremity Assessment: Defer to OT evaluation     Lower Extremity Assessment Lower Extremity Assessment: Overall WFL for tasks assessed    Cervical / Trunk Assessment Cervical / Trunk Assessment: Normal  Communication   Communication: No difficulties  Cognition Arousal/Alertness: Awake/alert Behavior During Therapy: WFL for tasks assessed/performed Overall Cognitive Status: Within Functional Limits for tasks assessed                                          General Comments General comments (skin integrity, edema, etc.): VSS on RA    Exercises     Assessment/Plan    PT Assessment Patient needs continued PT services  PT Problem List Decreased mobility;Pain;Decreased activity tolerance       PT Treatment Interventions Therapeutic activities;Gait training;Therapeutic exercise;Patient/family education;Stair training;Functional mobility training    PT Goals (Current goals can be found in the Care Plan section)  Acute Rehab PT Goals Patient Stated Goal: decrease pain PT Goal Formulation: With patient Time For Goal Achievement: 01/06/21 Potential to Achieve Goals: Good    Frequency Min 3X/week   Barriers to discharge        Co-evaluation               AM-PAC PT "6 Clicks" Mobility  Outcome Measure Help needed turning from your back to your side while in a flat bed without using bedrails?: None Help needed moving from lying on your back to sitting on the side of a flat bed without using bedrails?: None Help needed moving to and from a bed to a chair (including a wheelchair)?: None Help needed standing up from a chair using your arms (e.g., wheelchair or bedside chair)?: None Help needed to walk in hospital room?: A Little Help needed climbing 3-5 steps with a railing? : A Little 6 Click Score: 22    End of Session   Activity Tolerance: Patient limited by pain Patient left: in bed;with call bell/phone within reach Nurse Communication: Mobility status PT Visit Diagnosis: Unsteadiness on  feet (R26.81)    Time: 4327-6147 PT Time Calculation (min) (ACUTE ONLY): 16 min   Charges:   PT Evaluation $PT Eval Moderate Complexity: 1 Mod          Aida Raider, PT  Office # [phone] Pager [phone]   Ilda Foil 12/23/2020, 11:43 AM

## 2020-12-23 NOTE — TOC CAGE-AID Note (Signed)
Transition of Care [facility]) - CAGE-AID Screening   Patient Details  Name: Miguel Anderson MRN: [id number] Date of Birth: [birth year]  Transition of Care [facility]) CM/SW Contact:    Diandre Merica C Tarpley-Carter, LCSWA Phone Number: 12/23/2020, 3:00 PM   Clinical Narrative: Pt participated in Cage-Aid.  Pt smokes cigarettes and drinks ETOH socially.  Pt was offered resources.  Pt stated resources were not needed.    Ram Haugan Tarpley-Carter, MSW, LCSW-A Pronouns:  She/Her/Hers [facility] Clinical Social Worker Direct Number:  [phone] [place] [email or web address]  CAGE-AID Screening:    Have You Ever Felt You Ought to Cut Down on Your Drinking or Drug Use?: No Have People Annoyed You By [employer] Your Drinking Or Drug Use?: No Have You Felt Bad Or Guilty About Your Drinking Or Drug Use?: No Have You Ever Had a Drink or Used Drugs First Thing In The Morning to Steady Your Nerves or to Get Rid of a Hangover?: No CAGE-AID Score: 0  Substance Abuse Education Offered: Yes (Pt smokes cigarettes and drinks ETOH socially.)  Substance abuse interventions: Transport planner

## 2020-12-23 NOTE — Plan of Care (Signed)
Patient ID: Colbin Jovel, male   DOB: 04-26-1955, 65 y.o.   MRN: [id number]  Problem: Education: Goal: Knowledge of General Education information will improve Description: Including pain rating scale, medication(s)/side effects and non-pharmacologic comfort measures Outcome: Progressing   Problem: Health Behavior/Discharge Planning: Goal: Ability to manage health-related needs will improve Outcome: Progressing   Problem: Clinical Measurements: Goal: Ability to maintain clinical measurements within normal limits will improve Outcome: Progressing Goal: Will remain free from infection Outcome: Progressing Goal: Diagnostic test results will improve Outcome: Progressing Goal: Respiratory complications will improve Outcome: Progressing Goal: Cardiovascular complication will be avoided Outcome: Progressing   Problem: Activity: Goal: Risk for activity intolerance will decrease Outcome: Progressing   Problem: Nutrition: Goal: Adequate nutrition will be maintained Outcome: Progressing   Problem: Coping: Goal: Level of anxiety will decrease Outcome: Progressing   Problem: Elimination: Goal: Will not experience complications related to bowel motility Outcome: Progressing Goal: Will not experience complications related to urinary retention Outcome: Progressing   Problem: Pain Managment: Goal: General experience of comfort will improve Outcome: Progressing   Problem: Safety: Goal: Ability to remain free from injury will improve Outcome: Progressing   Problem: Skin Integrity: Goal: Risk for impaired skin integrity will decrease Outcome: Progressing   Lidia Collum, RN

## 2020-12-23 NOTE — Evaluation (Signed)
Occupational Therapy Evaluation Patient Details Name: Miguel Anderson MRN: [id number] DOB: August 21, 1955 Today's Date: 12/23/2020   History of Present Illness Pt is a 65 y.o. male admitted 11/14 following a fall at home. Xrays revealed R 5,6,10 rib fxs and L 6-9, 6-8 segmental rib fxs. PMH: hypercholesterolemia and chronic low back pain   Clinical Impression   Patient admitted for the diagnosis above.  PTA he was living alone, working full time, and needed no assist with ADL/IADL.  Pain with mobility, and no BM are his biggest concerns.  Currently he is able to mobilize in his room without assist, but is needing Min A for lower body ADL.  He is considering staying with his son for a week, possible two.  All education provided, and patient encouraged to walk with staff throughout the day.        Recommendations for follow up therapy are one component of a multi-disciplinary discharge planning process, led by the attending physician.  Recommendations may be updated based on patient status, additional functional criteria and insurance authorization.   Follow Up Recommendations       Assistance Recommended at Discharge Intermittent Supervision/Assistance  Functional Status Assessment  Patient has had a recent decline in their functional status and demonstrates the ability to make significant improvements in function in a reasonable and predictable amount of time.  Equipment Recommendations  Tub/shower seat (Long handled sponge and long handled reacher)    Recommendations for Other Services       Precautions / Restrictions Precautions Precautions: Fall Restrictions Weight Bearing Restrictions: No      Mobility Bed Mobility Overal bed mobility: Modified Independent                  Transfers Overall transfer level: Modified independent                        Balance Overall balance assessment: No apparent balance deficits (not formally assessed)                                          ADL either performed or assessed with clinical judgement   ADL                   Upper Body Dressing : Modified independent;Sitting   Lower Body Dressing: Minimal assistance;Sit to/from stand Lower Body Dressing Details (indicate cue type and reason): difficulty reaching for his feet Toilet Transfer: Modified Independent Toilet Transfer Details (indicate cue type and reason): No AD needed, pain with mobility.                 Vision Baseline Vision/History: 1 Wears glasses Patient Visual Report: No change from baseline       Perception Perception Perception: Not tested   Praxis Praxis Praxis: Not tested    Pertinent Vitals/Pain Faces Pain Scale: Hurts whole lot Pain Location: fx sites with mobility Pain Descriptors / Indicators: Stabbing;Spasm;Sharp;Grimacing;Guarding;Moaning Pain Intervention(s): Monitored during session     Hand Dominance Right   Extremity/Trunk Assessment Upper Extremity Assessment Upper Extremity Assessment: Overall WFL for tasks assessed   Lower Extremity Assessment Lower Extremity Assessment: Overall WFL for tasks assessed   Cervical / Trunk Assessment Cervical / Trunk Assessment: Normal   Communication Communication Communication: No difficulties   Cognition Arousal/Alertness: Awake/alert Behavior During Therapy: WFL for tasks assessed/performed Overall Cognitive Status: Within Functional Limits  for tasks assessed                                                        Home Living Family/patient expects to be discharged to:: Private residence Living Arrangements: Other relatives (may be able to stay with his son for a couple weeks) Available Help at Discharge: Family;Available PRN/intermittently Type of Home: Apartment Home Access: Stairs to enter Entrance Stairs-Number of Steps: 4 Entrance Stairs-Rails: Right;Left       Bathroom Shower/Tub: Multimedia programmer: Standard     Home Equipment: None          Prior Functioning/Environment Prior Level of Function : Independent/Modified Independent;Driving;Working/employed                        OT Problem List: Pain      OT Treatment/Interventions:      OT Goals(Current goals can be found in the care plan section) Acute Rehab OT Goals Patient Stated Goal: Reduce the pain OT Goal Formulation: With patient Time For Goal Achievement: 12/23/20 Potential to Achieve Goals: Good  OT Frequency:     Barriers to D/C:  Lives alone          Co-evaluation              AM-PAC OT "6 Clicks" Daily Activity     Outcome Measure Help from another person eating meals?: None Help from another person taking care of personal grooming?: None Help from another person toileting, which includes using toliet, bedpan, or urinal?: None Help from another person bathing (including washing, rinsing, drying)?: A Little Help from another person to put on and taking off regular upper body clothing?: None Help from another person to put on and taking off regular lower body clothing?: A Little 6 Click Score: 22   End of Session Nurse Communication: Mobility status  Activity Tolerance: Patient tolerated treatment well Patient left: in bed;with call bell/phone within reach  OT Visit Diagnosis: Pain                Time: 1547-1611 OT Time Calculation (min): 24 min Charges:  OT General Charges $OT Visit: 1 Visit OT Evaluation $OT Eval Moderate Complexity: 1 Mod OT Treatments $Self Care/Home Management : 8-22 mins  12/23/2020  RP, OTR/L  Acute Rehabilitation Services  Office:  [phone]   Suzanna Obey 12/23/2020, 4:18 PM

## 2020-12-23 NOTE — Progress Notes (Signed)
Subjective: Patient c/o severe pain with movement.  Pulling 2000 on IS.  C/o constipation and no BM for 3 days.  Otherwise no new complaints.  ROS: See above, otherwise other systems negative  Objective: Vital signs in last 24 hours: Temp:  [97.7 F (36.5 C)-98.1 F (36.7 C)] 97.7 F (36.5 C) (11/16 0503) Pulse Rate:  [50-64] 60 (11/16 0503) Resp:  [15-18] 16 (11/15 1926) BP: (151-196)/(78-103) 172/103 (11/16 0503) SpO2:  [95 %-100 %] 95 % (11/16 0503) Last BM Date: 12/20/20  Intake/Output from previous day: 11/15 0701 - 11/16 0700 In: 240 [P.O.:240] Out: -  Intake/Output this shift: No intake/output data recorded.  PE: Gen: NAD while laying still in bed HEENT: PERRL Heart: regular Lungs: CTAB, significant chest wall tenderness to palpation Abd: soft, NT, ND Ext: MAE Neuro: NVI, sensation normal throughout Psych: A&Ox3  Lab Results:  Recent Labs    12/21/20 1537 12/22/20 2226  WBC 11.4* 7.6  HGB 18.6* 16.7  HCT 55.3* 48.2  PLT 190 153   BMET Recent Labs    12/21/20 1537 12/22/20 2107  NA 140  --   K 3.3*  --   CL 100  --   CO2 30  --   GLUCOSE 108*  --   BUN 8  --   CREATININE 0.66 0.83  CALCIUM 8.8*  --    PT/INR No results for input(s): LABPROT, INR in the last 72 hours. CMP     Component Value Date/Time   NA 140 12/21/2020 1537   K 3.3 (L) 12/21/2020 1537   CL 100 12/21/2020 1537   CO2 30 12/21/2020 1537   GLUCOSE 108 (H) 12/21/2020 1537   BUN 8 12/21/2020 1537   CREATININE 0.83 12/22/2020 2107   CALCIUM 8.8 (L) 12/21/2020 1537   PROT 7.9 12/21/2020 1537   ALBUMIN 4.2 12/21/2020 1537   AST 35 12/21/2020 1537   ALT 25 12/21/2020 1537   ALKPHOS 122 12/21/2020 1537   BILITOT 1.1 12/21/2020 1537   GFRNONAA >60 12/22/2020 2107   GFRAA >90 03/26/2013 1855   Lipase     Component Value Date/Time   LIPASE 28 12/21/2020 1537       Studies/Results: DG Chest 2 View  Result Date: 12/21/2020 CLINICAL DATA:  Back pain; recent  fall EXAM: CHEST - 2 VIEW COMPARISON:  2015 FINDINGS: Elevation of left hemidiaphragm. Patchy density at the left lung base. No pleural effusion. Cardiomediastinal contours are within normal limits. Possibly acute lateral right rib fractures (likely sixth, seventh, and eighth). IMPRESSION: Elevation of left hemidiaphragm. Patchy atelectasis/consolidation at the left lung base. Possibly acute lateral right rib fractures. Electronically Signed   By: Guadlupe Spanish M.D.   On: 12/21/2020 15:58   CT Angio Chest PE W and/or Wo Contrast  Result Date: 12/21/2020 CLINICAL DATA:  PE suspected, high prob Mid back pain for 4-5 days.  Fall in the bathroom. EXAM: CT ANGIOGRAPHY CHEST WITH CONTRAST TECHNIQUE: Multidetector CT imaging of the chest was performed using the standard protocol during bolus administration of intravenous contrast. Multiplanar CT image reconstructions and MIPs were obtained to evaluate the vascular anatomy. CONTRAST:  65mL OMNIPAQUE IOHEXOL 350 MG/ML SOLN COMPARISON:  Chest radiograph earlier today. FINDINGS: Cardiovascular: There are no filling defects within the pulmonary arteries to suggest pulmonary embolus. Aortic atherosclerosis without aneurysm or acute aortic abnormalities. 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 The heart is normal in size. There are coronary artery calcifications. No pericardial  effusion. Mediastinum/Nodes: No adenopathy. No mediastinal hematoma. Decompressed esophagus. No thyroid nodule. Lungs/Pleura: Elevation of left hemidiaphragm. There is adjacent airspace disease in the left lower lobe and lingula with few air bronchograms. Trace left pleural thickening/small effusion. Minor subsegmental atelectasis in the dependent right lower lobe. There is no right pleural effusion. No pneumothorax. The trachea and central bronchi are patent. No findings of pulmonary edema. Upper Abdomen: Possible hepatic steatosis. There are 2 subcentimeter  low-density lesions in the left lobe of the liver that are too small to characterize, likely small cysts. Splenules are noted at the inferior splenic hilum. No free fluid or acute findings in the upper abdomen. Musculoskeletal: Minimally displaced posterolateral right fifth and sixth rib fractures. There is a nondisplaced posterior right tenth rib fracture. Left rib fractures, with the sixth through eighth rib fractures being segmental. Fractures of the left sixth through ninth ribs at the costovertebral junctions, as well as fractures of posterior sixth, seventh, and eighth ribs. The thoracic spine is partially ankylosed with flowing anterior osteophytes throughout. No evidence is acute thoracic spine fracture or abnormal alignment of the ankylosed spine. No acute fracture of the sternum, included clavicles or shoulder girdles. Review of the MIP images confirms the above findings. IMPRESSION: 1. No pulmonary embolus. 2. Bilateral rib fractures. Right fifth and sixth and tenth rib fractures are minimally displaced. Left rib fractures 6 through 9, with sixth through eighth fractures being segmental, raising possibility of flail chest segment. 3. No pneumothorax. Trace left pleural thickening/small effusion with left basilar airspace disease in the lingula and left lower lobe related to elevated hemidiaphragm. 4. Ankylosis of the thoracic spine. No evidence of acute thoracic spine fracture or abnormal alignment of the ankylosed spine. 5. Aortic atherosclerosis and coronary artery calcifications. 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 Aortic Atherosclerosis (ICD10-I70.0). Electronically Signed   By: Narda Rutherford M.D.   On: 12/21/2020 17:16    Anti-infectives: Anti-infectives (From admission, onward)    None        Assessment/Plan Fall R 5,6,10 rib fxs - multimodal pain control, IS, mobilization L 6-9, 6-8 segmental rib fxs with possible early PNA - multimodal pain control, IS, pulm  toilet, mobilization.  CXR this morning favors atelectasis over PNA.  No clinical signs of PNA.  Pulls 2000 on IS.  Add lidoderm patch for pain control as well.  Awaiting therapies Chronic low back pain Hypercholesterolemia  FEN - regular, miralax, suppository VTE - lovenox ID - none Dispo - therapies today, pain control, hopeful for DC tomorrow   LOS: 2 days    Letha Cape , [facility] Surgery 12/23/2020, 8:08 AM Please see Amion for pager number during day hours 7:00am-4:30pm or 7:00am -11:30am on weekends

## 2020-12-24 DIAGNOSIS — S2243XA Multiple fractures of ribs, bilateral, initial encounter for closed fracture: Secondary | ICD-10-CM | POA: Diagnosis not present

## 2020-12-24 LAB — BASIC METABOLIC PANEL
Anion gap: 9 (ref 5–15)
BUN: 13 mg/dL (ref 8–23)
CO2: 26 mmol/L (ref 22–32)
Calcium: 8.5 mg/dL — ABNORMAL LOW (ref 8.9–10.3)
Chloride: 100 mmol/L (ref 98–111)
Creatinine, Ser: 0.7 mg/dL (ref 0.61–1.24)
GFR, Estimated: >60 mL/min (ref >60)
Glucose, Bld: 135 mg/dL — ABNORMAL HIGH (ref 70–99)
Potassium: 3.2 mmol/L — ABNORMAL LOW (ref 3.5–5.1)
Sodium: 135 mmol/L (ref 135–145)

## 2020-12-24 LAB — CBC
HCT: 47.9 % (ref 39.0–52.0)
Hemoglobin: 16.5 g/dL (ref 13.0–17.0)
MCH: 34.7 pg — ABNORMAL HIGH (ref 26.0–34.0)
MCHC: 34.4 g/dL (ref 30.0–36.0)
MCV: 100.8 fL — ABNORMAL HIGH (ref 80.0–100.0)
Platelets: 142 10*3/uL — ABNORMAL LOW (ref 150–400)
RBC: 4.75 MIL/uL (ref 4.22–5.81)
RDW: 11.9 % (ref 11.5–15.5)
WBC: 5.7 10*3/uL (ref 4.0–10.5)
nRBC: 0 % (ref 0.0–0.2)

## 2020-12-24 MED ORDER — INFLUENZA VAC SPLIT QUAD 0.5 ML IM SUSY
0.5000 mL | PREFILLED_SYRINGE | INTRAMUSCULAR | Status: AC
  Administered 2020-12-24: 0.5 mL via INTRAMUSCULAR
  Filled 2020-12-24: qty 0.5

## 2020-12-24 MED ORDER — ACETAMINOPHEN 500 MG PO TABS: 1000.0000 mg | ORAL_TABLET | Freq: Four times a day (QID) | ORAL | 0 refills | Status: AC

## 2020-12-24 MED ORDER — BISACODYL 10 MG RE SUPP
10.0000 mg | Freq: Once | RECTAL | Status: AC
  Administered 2020-12-24: 13:00:00 10 mg via RECTAL
  Filled 2020-12-24: qty 1

## 2020-12-24 MED ORDER — METHOCARBAMOL 500 MG PO TABS: 1000.0000 mg | ORAL_TABLET | Freq: Three times a day (TID) | ORAL | 0 refills | Status: AC | PRN

## 2020-12-24 MED ORDER — OXYCODONE HCL 5 MG PO TABS: 5.0000 mg | ORAL_TABLET | Freq: Four times a day (QID) | ORAL | 0 refills | Status: AC | PRN

## 2020-12-24 MED ORDER — POLYETHYLENE GLYCOL 3350 17 G PO PACK: 17.0000 g | PACK | Freq: Two times a day (BID) | ORAL | 0 refills | Status: AC

## 2020-12-24 MED ORDER — LIDOCAINE 5 % EX PTCH: 2.0000 | MEDICATED_PATCH | CUTANEOUS | 0 refills | Status: AC

## 2020-12-24 NOTE — Progress Notes (Signed)
   12/24/20 1100   PT - Assessment/Plan  Follow Up Recommendations No PT follow up  Assistance recommended at discharge Intermittent Supervision/Assistance  PT equipment Rolling walker (2 wheels)  Full note to follow.  Pt would benefit from RW for home use.  Pt agreeable this am to this.  Also has OT equipment recommended.   Gibran Veselka M,PT Acute Rehab Services [phone] (pager)

## 2020-12-24 NOTE — Progress Notes (Signed)
Physical Therapy Treatment Patient Details Name: Miguel Anderson MRN: [id number] DOB: Jun 17, 1955 Today's Date: 12/24/2020   History of Present Illness Pt is a 65 y.o. male admitted 11/14 following a fall at home. Xrays revealed R 5,6,10 rib fxs and L 6-9, 6-8 segmental rib fxs. PMH: hypercholesterolemia and chronic low back pain    PT Comments    Pt admitted with above diagnosis. Pt was able to ambulate without RW but was unsteady at times without device.  Pt did best with RW and is agreeable to use of a RW at home. Pt progressing and will most likely go home today.  Pt currently with functional limitations due to balance and endurance deficits. Pt will benefit from skilled PT to increase their independence and safety with mobility to allow discharge to the venue listed below.      Recommendations for follow up therapy are one component of a multi-disciplinary discharge planning process, led by the attending physician.  Recommendations may be updated based on patient status, additional functional criteria and insurance authorization.  Follow Up Recommendations  No PT follow up     Assistance Recommended at Discharge Intermittent Supervision/Assistance  Equipment Recommendations  Rolling walker (2 wheels)    Recommendations for Other Services       Precautions / Restrictions Precautions Precautions: Fall Restrictions Weight Bearing Restrictions: No     Mobility  Bed Mobility Overal bed mobility: Modified Independent             General bed mobility comments: +rail, increased time due to pain, HOB elevated    Transfers Overall transfer level: Modified independent Equipment used: None               General transfer comment: increased time due to pain    Ambulation/Gait Ambulation/Gait assistance: Supervision Gait Distance (Feet): 300 Feet Assistive device: None;Rolling walker (2 wheels) Gait Pattern/deviations: Step-through pattern;Decreased stride  length Gait velocity: decreased Gait velocity interpretation: <1.8 ft/sec, indicate of risk for recurrent falls   General Gait Details: reaching for support of bed/counter in room when sharp episode of pain occured. No LOB noted. No knee buckling. Pt at times having incr pain and bends forward with unsteadiness noted when this happens.  Recommended RW and pt tried and liked it.   Stairs Stairs: Yes Stairs assistance: Min guard Stair Management: One rail Right;Step to pattern;Alternating pattern;Forwards Number of Stairs: 5 General stair comments: Pt can ascend and descend steps with min guard assist.   Wheelchair Mobility    Modified Rankin (Stroke Patients Only)       Balance Overall balance assessment: No apparent balance deficits (not formally assessed)                                          Cognition Arousal/Alertness: Awake/alert Behavior During Therapy: WFL for tasks assessed/performed Overall Cognitive Status: Within Functional Limits for tasks assessed                                          Exercises      General Comments General comments (skin integrity, edema, etc.): VSS on RA      Pertinent Vitals/Pain Pain Assessment: Faces Faces Pain Scale: Hurts whole lot Pain Location: fx sites with mobility Pain Descriptors / Indicators: Stabbing;Spasm;Sharp;Grimacing;Guarding;Moaning Pain Intervention(s): Limited activity  within patient's tolerance;Monitored during session;Repositioned    Home Living                          Prior Function            PT Goals (current goals can now be found in the care plan section) Acute Rehab PT Goals Patient Stated Goal: decrease pain Progress towards PT goals: Progressing toward goals    Frequency    Min 3X/week      PT Plan Current plan remains appropriate    Co-evaluation              AM-PAC PT "6 Clicks" Mobility   Outcome Measure  Help needed  turning from your back to your side while in a flat bed without using bedrails?: None Help needed moving from lying on your back to sitting on the side of a flat bed without using bedrails?: None Help needed moving to and from a bed to a chair (including a wheelchair)?: None Help needed standing up from a chair using your arms (e.g., wheelchair or bedside chair)?: None Help needed to walk in hospital room?: A Little Help needed climbing 3-5 steps with a railing? : A Little 6 Click Score: 22    End of Session Equipment Utilized During Treatment: Gait belt Activity Tolerance: Patient limited by pain;Patient limited by fatigue Patient left: in bed;with call bell/phone within reach Nurse Communication: Mobility status PT Visit Diagnosis: Unsteadiness on feet (R26.81)     Time: 8250-5397 PT Time Calculation (min) (ACUTE ONLY): 16 min  Charges:  $Gait Training: 8-22 mins                     Myleah Cavendish M,PT Acute Rehab Services [phone] (pager)    Bevelyn Buckles 12/24/2020, 12:48 PM

## 2020-12-24 NOTE — Discharge Instructions (Signed)
You  may use ice or heat to your back for pain control as needed  You may increase your activity as you are able.

## 2020-12-24 NOTE — Discharge Summary (Signed)
Patient ID: Miguel Anderson [id number] March 09, 1955 65 y.o.  Admit date: 12/21/2020 Discharge date: 12/24/2020  Admitting Diagnosis: Fall R 5,6,10 rib fxs  L 6-9, 6-8 segmental rib fxs with possible early PNA  Chronic low back pain Hypercholesterolemia   Discharge Diagnosis Patient Active Problem List   Diagnosis Date Noted   Ribs, multiple fractures 12/22/2020   Rib fractures 12/21/2020   Palpitations 03/27/2013   Tobacco use disorder 03/27/2013   Chest pain 03/26/2013  Fall R 5,6,10 rib fxs  L 6-9, 6-8 segmental rib fxs with possible early PNA  Chronic low back pain Hypercholesterolemia   Consultants none  Reason for Admission: This is a 65 yo white male with a history of hypercholesterolemia and chronic low back pain who fell several days ago and developed bilateral upper back pain that is severe in nature.  Movement is extremely difficult.  He denies CP, SOB, fevers, chills, N/V.  He took some Ibuprofen with no relief.  He presented to [facility] for evaluation and was found to have B rib fractures, multiple segments on the left side. He was also noted to have some left airspace disease concerning for possible developing PNA.  Given his need for pain control and aggressive pulmonary toileting, he was transferred to [facility] to the trauma service for evaluation.  Procedures none  Hospital Course:  The patient was admitted for observation and pain control.  Follow up x-ray revealed no evidence of PNA.  Multi-modal pain control was used for pain management.  He worked with therapies who did not recommend outpatient follow up.  He was stable on HD 2 for DC home.  Physical Exam: Gen: NAD while laying still in bed HEENT: PERRL Heart: regular Lungs: CTAB, significant chest wall tenderness to palpation Abd: soft, NT, ND Ext: MAE Neuro: NVI, sensation normal throughout Psych: A&Ox3  Allergies as of 12/24/2020   No Known Allergies      Medication List     STOP taking these  medications    cyclobenzaprine 10 MG tablet Commonly known as: FLEXERIL   ibuprofen 200 MG tablet Commonly known as: ADVIL   tiZANidine 4 MG tablet Commonly known as: Zanaflex   traMADol 50 MG tablet Commonly known as: ULTRAM       TAKE these medications    acetaminophen 500 MG tablet Commonly known as: TYLENOL Take 2 tablets (1,000 mg total) by mouth every 6 (six) hours. What changed:  how much to take when to take this reasons to take this   BAYER BACK & BODY PO Take 1 tablet by mouth 2 (two) times daily.   lidocaine 5 % Commonly known as: LIDODERM Place 2 patches onto the skin daily. Remove & Discard patch within 12 hours or as directed by MD Start taking on: December 25, 2020 What changed: See the new instructions.   methocarbamol 500 MG tablet Commonly known as: ROBAXIN Take 2 tablets (1,000 mg total) by mouth every 8 (eight) hours as needed for muscle spasms.   omeprazole 20 MG capsule Commonly known as: PRILOSEC Take 1 capsule by mouth daily.   oxyCODONE 5 MG immediate release tablet Commonly known as: Oxy IR/ROXICODONE Take 1-2 tablets (5-10 mg total) by mouth every 6 (six) hours as needed for moderate pain.   polyethylene glycol 17 g packet Commonly known as: MIRALAX / GLYCOLAX Take 17 g by mouth 2 (two) times daily.               Durable Medical Equipment  (From admission,  onward)           Start     Ordered   12/24/20 1149  For home use only DME Walker rolling  Once       Question Answer Comment  Walker: With 5 Inch Wheels   Patient needs a walker to treat with the following condition Multiple rib fractures      12/24/20 1149              Follow-up Information     Clinic, [facility] Follow up.   Why: As needed for your rib fractures Contact information: [address] [facility] [place] [address] [phone]         [facility] Follow up.   Why: No follow up needed, call if you have  questions Contact information: [address] [place] [address] [phone]                Signed: Barnetta Chapel, [facility] Surgery 12/24/2020, 11:50 AM Please see Amion for pager number during day hours 7:00am-4:30pm, 7-11:30am on Weekends

## 2020-12-24 NOTE — TOC Transition Note (Signed)
Transition of Care [facility]) - CM/SW Discharge Note   Patient Details  Name: Miguel Anderson MRN: [id number] Date of Birth: 08-Jun-1955  Transition of Care [facility]) CM/SW Contact:  Glennon Mac, RN Phone Number: 12/24/2020, 11:00am  Clinical Narrative:    Pt is a 65 y.o. male admitted 11/14 following a fall at home. Xrays revealed R 5,6,10 rib fxs and L 6-9, 6-8 segmental rib fxs.  PTA, pt independent and living at home alone.  PT/OT recommending  no OP follow up, RW for home.  Patient states he will borrow RW from son/dtr in law. He plans to discharge home with son and daughter in law, and stay with them while he recovers.   1450 Addendum: Notified that patient has no transportation home.  Referral to [employer]; transport waiver signed.  Patient discharging to son's home at [address] [place], [place] [address].  Estimated pickup time 1510pm.  Notified bedside nurse to take patient down for pickup.     Final next level of care: Home/Self Care Barriers to Discharge: Barriers Resolved   Patient Goals and CMS Choice Patient states their goals for this hospitalization and ongoing recovery are:: to feel better                          Discharge Plan and Services   Discharge Planning Services: CM Consult                                  Readmission Risk Interventions No flowsheet data found.  Quintella Baton, RN, BSN  Trauma/Neuro ICU Case Manager [phone]

## 2021-01-01 ENCOUNTER — Ambulatory Visit (INDEPENDENT_AMBULATORY_CARE_PROVIDER_SITE_OTHER): Payer: No Typology Code available for payment source

## 2021-01-01 ENCOUNTER — Ambulatory Visit
Admission: RE | Admit: 2021-01-01 | Discharge: 2021-01-01 | Disposition: A | Discharge status: Home / Self Care | Payer: No Typology Code available for payment source | Source: Ambulatory Visit | Attending: Urgent Care | Admitting: Urgent Care

## 2021-01-01 ENCOUNTER — Other Ambulatory Visit: Payer: Self-pay

## 2021-01-01 VITALS — BP 165/97 | HR 70 | Temp 98.0°F | Resp 22

## 2021-01-01 DIAGNOSIS — R051 Acute cough: Secondary | ICD-10-CM

## 2021-01-01 DIAGNOSIS — J189 Pneumonia, unspecified organism: Secondary | ICD-10-CM | POA: Diagnosis not present

## 2021-01-01 DIAGNOSIS — R059 Cough, unspecified: Secondary | ICD-10-CM | POA: Diagnosis not present

## 2021-01-01 DIAGNOSIS — S2243XD Multiple fractures of ribs, bilateral, subsequent encounter for fracture with routine healing: Secondary | ICD-10-CM

## 2021-01-01 DIAGNOSIS — R079 Chest pain, unspecified: Secondary | ICD-10-CM

## 2021-01-01 MED ORDER — PROMETHAZINE-DM 6.25-15 MG/5ML PO SYRP: 5.0000 mL | ORAL_SOLUTION | Freq: Every evening | ORAL | 0 refills | Status: DC | PRN

## 2021-01-01 MED ORDER — AMOXICILLIN 500 MG PO CAPS: 1000.0000 mg | ORAL_CAPSULE | Freq: Three times a day (TID) | ORAL | 0 refills | Status: DC

## 2021-01-01 MED ORDER — AZITHROMYCIN 250 MG PO TABS: ORAL_TABLET | ORAL | 0 refills | Status: DC

## 2021-01-01 MED ORDER — BENZONATATE 100 MG PO CAPS: 100.0000 mg | ORAL_CAPSULE | Freq: Three times a day (TID) | ORAL | 0 refills | Status: DC | PRN

## 2021-01-01 MED ORDER — TIZANIDINE HCL 4 MG PO TABS: 4.0000 mg | ORAL_TABLET | Freq: Every day | ORAL | 0 refills | Status: DC

## 2021-01-01 MED ORDER — TIZANIDINE HCL 4 MG PO TABS: 4.0000 mg | ORAL_TABLET | Freq: Every day | ORAL | 0 refills | Status: AC

## 2021-01-01 NOTE — ED Triage Notes (Signed)
PT reports multiple known rib fractures. Has been using incentive spirometer. Has developed a cough over last few days.

## 2021-01-01 NOTE — ED Provider Notes (Signed)
[facility]   MRN: [id number] DOB: 03/02/1955  Subjective:   Miguel Anderson is a 65 y.o. male presenting for 4 to 5-day history of acute onset persistent productive cough that is eliciting an worsening chest pain from multiple rib fractures on either side.  He sustained this injury 12/21/2020, was seen and hospitalized for this.  Refer to documentation for more detail.  He is also felt feverish, fatigue, passing really thick productive mucus from his coughing.  Has been trying to use his incentive spirometer but is worried that he may have pneumonia.  No current facility-administered medications for this encounter.  Current Outpatient Medications:    acetaminophen (TYLENOL) 500 MG tablet, Take 2 tablets (1,000 mg total) by mouth every 6 (six) hours., Disp: 30 tablet, Rfl: 0   Aspirin-Caffeine (BAYER BACK & BODY PO), Take 1 tablet by mouth 2 (two) times daily., Disp: , Rfl:    lidocaine (LIDODERM) 5 %, Place 2 patches onto the skin daily. Remove & Discard patch within 12 hours or as directed by MD, Disp: 20 patch, Rfl: 0   methocarbamol (ROBAXIN) 500 MG tablet, Take 2 tablets (1,000 mg total) by mouth every 8 (eight) hours as needed for muscle spasms., Disp: 60 tablet, Rfl: 0   omeprazole (PRILOSEC) 20 MG capsule, Take 1 capsule by mouth daily. (Patient not taking: Reported on 12/21/2020), Disp: , Rfl:    oxyCODONE (OXY IR/ROXICODONE) 5 MG immediate release tablet, Take 1-2 tablets (5-10 mg total) by mouth every 6 (six) hours as needed for moderate pain., Disp: 30 tablet, Rfl: 0   polyethylene glycol (MIRALAX / GLYCOLAX) 17 g packet, Take 17 g by mouth 2 (two) times daily., Disp: 14 each, Rfl: 0   No Known Allergies  Past Medical History:  Diagnosis Date   Hypercholesterolemia      History reviewed. No pertinent surgical history.  History reviewed. No pertinent family history.  Social History   Tobacco Use   Smoking status: Every Day    Packs/day: 0.75    Types:  Cigarettes   Smokeless tobacco: Never  Substance Use Topics   Alcohol use: Yes    Comment: occ on weekends   Drug use: No    ROS   Objective:   Vitals: BP (!) 165/97   Pulse 70   Temp 98 F (36.7 C) (Temporal)   Resp (!) 22   SpO2 96%   Physical Exam Constitutional:      General: He is not in acute distress.    Appearance: Normal appearance. He is well-developed. He is not ill-appearing, toxic-appearing or diaphoretic.  HENT:     Head: Normocephalic and atraumatic.     Right Ear: External ear normal.     Left Ear: External ear normal.     Nose: Nose normal.     Mouth/Throat:     Mouth: Mucous membranes are moist.     Pharynx: Oropharynx is clear.  Eyes:     General: No scleral icterus.    Extraocular Movements: Extraocular movements intact.     Pupils: Pupils are equal, round, and reactive to light.  Cardiovascular:     Rate and Rhythm: Normal rate and regular rhythm.     Heart sounds: Normal heart sounds. No murmur heard.   No friction rub. No gallop.  Pulmonary:     Effort: Pulmonary effort is normal. No respiratory distress.     Breath sounds: No stridor. No wheezing, rhonchi or rales.     Comments: Decreased lung sounds bilaterally.  Neurological:     Mental Status: He is alert and oriented to person, place, and time.  Psychiatric:        Mood and Affect: Mood normal.        Behavior: Behavior normal.        Thought Content: Thought content normal.    DG Chest 2 View  Result Date: 01/01/2021 CLINICAL DATA:  Productive cough. EXAM: CHEST - 2 VIEW COMPARISON:  Chest x-ray 12/23/2020. FINDINGS: There are minimal patchy multifocal airspace and strandy opacities in the bilateral mid and lower lungs. The costophrenic angles are clear. There is no pleural effusion or pneumothorax. The cardiomediastinal silhouette is within normal limits. No acute fractures are seen. IMPRESSION: 1. Minimal bilateral multifocal airspace disease worrisome for infection. Electronically  Signed   By: Darliss Cheney M.D.   On: 01/01/2021 15:42    Assessment and Plan :   PDMP not reviewed this encounter.  1. Multifocal pneumonia   2. Chest pain, unspecified type   3. Acute cough   4. Multiple closed fractures of ribs of both sides with routine healing, subsequent encounter    Patient is to start dual therapy with amoxicillin and azithromycin based off the dosing recommended through up-to-date.  Emphasized need for close follow-up with his PCP.  Use supportive care otherwise.  COVID and flu testing pending.  Patient is hemodynamically stable, is not in respiratory distress and therefore will defer ER visit for now. Counseled patient on potential for adverse effects with medications prescribed/recommended today, ER and return-to-clinic precautions discussed, patient verbalized understanding.    Wallis Bamberg, PA-C 01/01/21 1557

## 2021-01-02 LAB — COVID-19, FLU A+B NAA
Influenza A, NAA: NOT DETECTED
Influenza B, NAA: NOT DETECTED
SARS-CoV-2, NAA: NOT DETECTED

## 2021-01-12 ENCOUNTER — Emergency Department (HOSPITAL_COMMUNITY): Payer: No Typology Code available for payment source

## 2021-01-12 ENCOUNTER — Encounter (HOSPITAL_COMMUNITY): Payer: Self-pay | Admitting: *Deleted

## 2021-01-12 ENCOUNTER — Emergency Department (HOSPITAL_COMMUNITY)
Admission: EM | Admit: 2021-01-12 | Discharge: 2021-01-12 | Disposition: A | Discharge status: Home / Self Care | Payer: No Typology Code available for payment source | Attending: Emergency Medicine | Admitting: Emergency Medicine

## 2021-01-12 DIAGNOSIS — R42 Dizziness and giddiness: Secondary | ICD-10-CM

## 2021-01-12 DIAGNOSIS — Z79899 Other long term (current) drug therapy: Secondary | ICD-10-CM | POA: Insufficient documentation

## 2021-01-12 DIAGNOSIS — Z7982 Long term (current) use of aspirin: Secondary | ICD-10-CM | POA: Diagnosis not present

## 2021-01-12 DIAGNOSIS — R0602 Shortness of breath: Secondary | ICD-10-CM | POA: Diagnosis not present

## 2021-01-12 DIAGNOSIS — F17210 Nicotine dependence, cigarettes, uncomplicated: Secondary | ICD-10-CM | POA: Diagnosis not present

## 2021-01-12 DIAGNOSIS — H81399 Other peripheral vertigo, unspecified ear: Secondary | ICD-10-CM

## 2021-01-12 LAB — CBC WITH DIFFERENTIAL/PLATELET
Abs Immature Granulocytes: 0.05 10*3/uL (ref 0.00–0.07)
Basophils Absolute: 0.1 10*3/uL (ref 0.0–0.1)
Basophils Relative: 1 %
Eosinophils Absolute: 0 10*3/uL (ref 0.0–0.5)
Eosinophils Relative: 0 %
HCT: 51.6 % (ref 39.0–52.0)
Hemoglobin: 17.8 g/dL — ABNORMAL HIGH (ref 13.0–17.0)
Immature Granulocytes: 0 %
Lymphocytes Relative: 11 %
Lymphs Abs: 1.5 10*3/uL (ref 0.7–4.0)
MCH: 35 pg — ABNORMAL HIGH (ref 26.0–34.0)
MCHC: 34.5 g/dL (ref 30.0–36.0)
MCV: 101.6 fL — ABNORMAL HIGH (ref 80.0–100.0)
Monocytes Absolute: 0.8 10*3/uL (ref 0.1–1.0)
Monocytes Relative: 6 %
Neutro Abs: 10.7 10*3/uL — ABNORMAL HIGH (ref 1.7–7.7)
Neutrophils Relative %: 82 %
Platelets: 247 10*3/uL (ref 150–400)
RBC: 5.08 MIL/uL (ref 4.22–5.81)
RDW: 12.3 % (ref 11.5–15.5)
WBC: 13.1 10*3/uL — ABNORMAL HIGH (ref 4.0–10.5)
nRBC: 0 % (ref 0.0–0.2)

## 2021-01-12 LAB — RAPID URINE DRUG SCREEN, HOSP PERFORMED
Amphetamines: NOT DETECTED
Barbiturates: NOT DETECTED
Benzodiazepines: NOT DETECTED
Cocaine: NOT DETECTED
Opiates: NOT DETECTED
Tetrahydrocannabinol: NOT DETECTED

## 2021-01-12 LAB — BASIC METABOLIC PANEL
Anion gap: 16 — ABNORMAL HIGH (ref 5–15)
BUN: 12 mg/dL (ref 8–23)
CO2: 22 mmol/L (ref 22–32)
Calcium: 9.1 mg/dL (ref 8.9–10.3)
Chloride: 99 mmol/L (ref 98–111)
Creatinine, Ser: 0.68 mg/dL (ref 0.61–1.24)
GFR, Estimated: >60 mL/min (ref >60)
Glucose, Bld: 140 mg/dL — ABNORMAL HIGH (ref 70–99)
Potassium: 3.3 mmol/L — ABNORMAL LOW (ref 3.5–5.1)
Sodium: 137 mmol/L (ref 135–145)

## 2021-01-12 LAB — TROPONIN I (HIGH SENSITIVITY)
Troponin I (High Sensitivity): 3 ng/L (ref <18)
Troponin I (High Sensitivity): 3 ng/L (ref <18)

## 2021-01-12 LAB — MAGNESIUM: Magnesium: 1.7 mg/dL (ref 1.7–2.4)

## 2021-01-12 MED ORDER — PREDNISONE 20 MG PO TABS: 20.0000 mg | ORAL_TABLET | Freq: Two times a day (BID) | ORAL | 0 refills | Status: DC

## 2021-01-12 MED ORDER — ONDANSETRON HCL 4 MG/2ML IJ SOLN
4.0000 mg | Freq: Once | INTRAMUSCULAR | Status: AC
  Administered 2021-01-12: 4 mg via INTRAVENOUS
  Filled 2021-01-12: qty 2

## 2021-01-12 MED ORDER — MECLIZINE HCL 12.5 MG PO TABS
25.0000 mg | ORAL_TABLET | Freq: Once | ORAL | Status: AC
  Administered 2021-01-12: 25 mg via ORAL
  Filled 2021-01-12: qty 2

## 2021-01-12 MED ORDER — POTASSIUM CHLORIDE 10 MEQ/100ML IV SOLN
10.0000 meq | INTRAVENOUS | Status: AC
  Administered 2021-01-12 (×2): 10 meq via INTRAVENOUS
  Filled 2021-01-12 (×2): qty 100

## 2021-01-12 MED ORDER — HALOPERIDOL LACTATE 5 MG/ML IJ SOLN
2.0000 mg | Freq: Once | INTRAMUSCULAR | Status: AC
  Administered 2021-01-12: 2 mg via INTRAVENOUS
  Filled 2021-01-12: qty 1

## 2021-01-12 MED ORDER — SODIUM CHLORIDE 0.9 % IV BOLUS
1000.0000 mL | Freq: Once | INTRAVENOUS | Status: AC
  Administered 2021-01-12: 1000 mL via INTRAVENOUS

## 2021-01-12 MED ORDER — DIAZEPAM 5 MG PO TABS
5.0000 mg | ORAL_TABLET | Freq: Once | ORAL | Status: AC
  Administered 2021-01-12: 5 mg via ORAL
  Filled 2021-01-12: qty 1

## 2021-01-12 MED ORDER — LORAZEPAM 1 MG PO TABS: 1.0000 mg | ORAL_TABLET | Freq: Four times a day (QID) | ORAL | 0 refills | Status: AC | PRN

## 2021-01-12 MED ORDER — PREDNISONE 50 MG PO TABS
60.0000 mg | ORAL_TABLET | Freq: Once | ORAL | Status: AC
  Administered 2021-01-12: 60 mg via ORAL
  Filled 2021-01-12: qty 1

## 2021-01-12 MED ORDER — LORAZEPAM 1 MG PO TABS
1.0000 mg | ORAL_TABLET | Freq: Once | ORAL | Status: AC
  Administered 2021-01-12: 1 mg via ORAL
  Filled 2021-01-12: qty 1

## 2021-01-12 NOTE — ED Notes (Signed)
Patient transported to MRI 

## 2021-01-12 NOTE — ED Provider Notes (Signed)
[facility] Provider Note   CSN: [id number] Arrival date & time: 01/12/21  1022     History Chief Complaint  Patient presents with   Dizziness    Nohl Trzcinski is a 65 y.o. male.  HPI 65 year old male presents with dizziness and vomiting.  Started this morning shortly after waking up.  He woke up around 8 and was okay.  After getting his phone from his car he stood up and he started feeling the symptoms.  Feels like the room is spinning.  Anytime he lifts his head up he gets recurrent symptoms.  They have not gone away since onset this morning.  No headache or focal weakness.  No numbness.  He is feeling short of breath more than typical despite being treated for pneumonia with antibiotics after his rib fractures.  He denies any new chest pain though his ribs still feels sore. Chronic tinnitus since leaving the military but no new symptoms. No vision changes.  Numerous episodes of dry heaving.  Past Medical History:  Diagnosis Date   Hypercholesterolemia     Patient Active Problem List   Diagnosis Date Noted   Ribs, multiple fractures 12/22/2020   Rib fractures 12/21/2020   Palpitations 03/27/2013   Tobacco use disorder 03/27/2013   Chest pain 03/26/2013    History reviewed. No pertinent surgical history.     No family history on file.  Social History   Tobacco Use   Smoking status: Every Day    Packs/day: 0.75    Types: Cigarettes   Smokeless tobacco: Never  Substance Use Topics   Alcohol use: Yes    Comment: occ on weekends   Drug use: No    Home Medications Prior to Admission medications   Medication Sig Start Date End Date Taking? Authorizing Provider  acetaminophen (TYLENOL) 500 MG tablet Take 2 tablets (1,000 mg total) by mouth every 6 (six) hours. 12/24/20   Saverio Danker, PA-C  amoxicillin (AMOXIL) 500 MG capsule Take 2 capsules (1,000 mg total) by mouth 3 (three) times daily. 01/01/21   Jaynee Eagles, PA-C  Aspirin-Caffeine  (BAYER BACK & BODY PO) Take 1 tablet by mouth 2 (two) times daily.    [provider]  azithromycin (ZITHROMAX) 250 MG tablet Day 1: take 2 tablets. Day 2-5: Take 1 tablet daily. 01/01/21   Jaynee Eagles, PA-C  benzonatate (TESSALON) 100 MG capsule Take 1-2 capsules (100-200 mg total) by mouth 3 (three) times daily as needed for cough. 01/01/21   Jaynee Eagles, PA-C  lidocaine (LIDODERM) 5 % Place 2 patches onto the skin daily. Remove & Discard patch within 12 hours or as directed by MD 12/25/20   Saverio Danker, PA-C  methocarbamol (ROBAXIN) 500 MG tablet Take 2 tablets (1,000 mg total) by mouth every 8 (eight) hours as needed for muscle spasms. 12/24/20   Saverio Danker, PA-C  omeprazole (PRILOSEC) 20 MG capsule Take 1 capsule by mouth daily. Patient not taking: Reported on 12/21/2020 05/14/20   [provider]  oxyCODONE (OXY IR/ROXICODONE) 5 MG immediate release tablet Take 1-2 tablets (5-10 mg total) by mouth every 6 (six) hours as needed for moderate pain. 12/24/20   Saverio Danker, PA-C  polyethylene glycol (MIRALAX / GLYCOLAX) 17 g packet Take 17 g by mouth 2 (two) times daily. 12/24/20   Saverio Danker, PA-C  promethazine-dextromethorphan (PROMETHAZINE-DM) 6.25-15 MG/5ML syrup Take 5 mLs by mouth at bedtime as needed for cough. 01/01/21   Jaynee Eagles, PA-C  tiZANidine (ZANAFLEX) 4 MG tablet  Take 1 tablet (4 mg total) by mouth at bedtime. 01/01/21   Wallis Bamberg, PA-C    Allergies    Patient has no known allergies.  Review of Systems   Review of Systems  Constitutional:  Negative for fever.  Eyes:  Negative for visual disturbance.  Respiratory:  Positive for shortness of breath.   Cardiovascular:  Negative for chest pain.  Gastrointestinal:  Positive for nausea and vomiting.  Neurological:  Positive for dizziness. Negative for headaches.  All other systems reviewed and are negative.  Physical Exam Updated Vital Signs BP (!) 155/89   Pulse (!) 51   Temp (!) 97.5 F  (36.4 C) (Oral)   Resp (!) 22   SpO2 98%   Physical Exam Vitals and nursing note reviewed.  Constitutional:      Appearance: He is well-developed. He is not ill-appearing or diaphoretic.  HENT:     Head: Normocephalic and atraumatic.     Right Ear: External ear normal.     Left Ear: External ear normal.     Nose: Nose normal.  Eyes:     General:        Right eye: No discharge.        Left eye: No discharge.     Pupils: Pupils are equal, round, and reactive to light.     Comments: Right sided nystagmus  Cardiovascular:     Rate and Rhythm: Normal rate and regular rhythm.     Heart sounds: Normal heart sounds.  Pulmonary:     Effort: Pulmonary effort is normal.     Breath sounds: Normal breath sounds.  Abdominal:     Palpations: Abdomen is soft.     Tenderness: There is no abdominal tenderness.  Musculoskeletal:     Cervical back: Neck supple.  Skin:    General: Skin is warm and dry.  Neurological:     Mental Status: He is alert.     Comments: CN 3-12 grossly intact. 5/5 strength in all 4 extremities. Grossly normal sensation. Normal finger to nose.   Psychiatric:        Mood and Affect: Mood is not anxious.    ED Results / Procedures / Treatments   Labs (all labs ordered are listed, but only abnormal results are displayed) Labs Reviewed  CBC WITH DIFFERENTIAL/PLATELET - Abnormal; Notable for the following components:      Result Value   WBC 13.1 (*)    Hemoglobin 17.8 (*)    MCV 101.6 (*)    MCH 35.0 (*)    Neutro Abs 10.7 (*)    All other components within normal limits  BASIC METABOLIC PANEL  TROPONIN I (HIGH SENSITIVITY)    EKG None  Radiology No results found.  Procedures Procedures   Medications Ordered in ED Medications  ondansetron (ZOFRAN) injection 4 mg (4 mg Intravenous Given 01/12/21 1257)  meclizine (ANTIVERT) tablet 25 mg (25 mg Oral Given 01/12/21 1258)    ED Course  I have reviewed the triage vital signs and the nursing  notes.  Pertinent labs & imaging results that were available during my care of the patient were reviewed by me and considered in my medical decision making (see chart for details).    MDM Rules/Calculators/A&P                           Patient's symptoms are consistent with vertigo.  Given the severity of them and requiring multiple doses of  meds, I think MRI is warranted to rule out acute CNS emergency such as stroke.  Initial blood work/work-up is benign.  He is feeling somewhat better.  Care to Dr. Eulis Foster. Final Clinical Impression(s) / ED Diagnoses Final diagnoses:  None    Rx / DC Orders ED Discharge Orders     None        Sherwood Gambler, MD 01/12/21 1554

## 2021-01-12 NOTE — ED Notes (Signed)
Pt given water and 3 packs of crackers for PO challenge. This RN told him he needs to ambulate but he states he will try in a few minutes

## 2021-01-12 NOTE — Discharge Instructions (Signed)
The testing today did not show stroke or other serious problem.  We are prescribing medicine to help improve your condition.  Make sure you are getting plenty of rest and drinking a lot of fluids.  Do not return to work until you are better.

## 2021-01-12 NOTE — ED Notes (Signed)
Lab to add on Magnesium to previous collection 

## 2021-01-12 NOTE — ED Notes (Signed)
Pts daughter in law is here to drive the patient home. Pt A&OX4 at d/c and wheeled out of ED via wheelchair. Pt verbalized understanding of d/c instructions, prescriptions and follow up care.

## 2021-01-12 NOTE — ED Notes (Addendum)
Attempted to ambulate patient. Was able to make it to bathroom holding on to staff member hand. Patient was dizzy and balance was staggered with steps

## 2021-01-12 NOTE — ED Notes (Signed)
Pt has returned from MRI and is dry heaving. Dr. Effie Shy informed.

## 2021-01-12 NOTE — ED Provider Notes (Signed)
3:50 PM-checkout from Dr. Criss Alvine to evaluate after MRI imaging.  Patient is being evaluated for dizziness and vomiting.  The symptoms are aggravated by head movement.  He has been treated with meclizine, Valium, ondansetron x2.  Potassium slightly low.  He has been severely tachycardic in the ED.   EKG Interpretation  Date/Time:  Tuesday January 12 2021 14:42:54 EST Ventricular Rate:  49 PR Interval:  153 QRS Duration: 123 QT Interval:  529 QTC Calculation: 478 R Axis:   16 Text Interpretation: Sinus bradycardia IVCD, consider atypical RBBB Confirmed by Pricilla Loveless [phone]) on 01/12/2021 3:37:08 PM        8:15 PM-he states he has not having dizziness when he moves his head now but if he gets on work he gets dizzy.  He describes a spinning sensation.  I discussed the findings with him which are reassuring at this point.  He would like to try eating and drinking something.  I will give him a dose of prednisone, Ativan, encourage him to eat and drink and ambulate.  At the time of discharge he is comfortable, tolerating oral nutrition and liquids.  Endings discussed with the patient and all questions were answered.  MDM-short-term onset symptoms of dizziness, with reassuring evaluation.  Doubt CVA, significant metabolic disorder or hemodynamic disorder.  Suspect peripheral vertigo from cause such as labyrinthitis.  Will treat with short term course of prednisone, and lorazepam for dizziness.  Work release given.  Recommended PCP follow-up   Mancel Bale, MD 01/13/21 1158

## 2021-01-12 NOTE — ED Triage Notes (Signed)
Dizziness with nausea onset today around 9am

## 2021-08-03 ENCOUNTER — Ambulatory Visit
Admission: EM | Admit: 2021-08-03 | Discharge: 2021-08-03 | Disposition: A | Discharge status: Home / Self Care | Payer: No Typology Code available for payment source | Attending: Family Medicine | Admitting: Family Medicine

## 2021-08-03 ENCOUNTER — Ambulatory Visit: Payer: Self-pay

## 2021-08-03 DIAGNOSIS — R110 Nausea: Secondary | ICD-10-CM | POA: Diagnosis not present

## 2021-08-03 DIAGNOSIS — K047 Periapical abscess without sinus: Secondary | ICD-10-CM | POA: Diagnosis not present

## 2021-08-03 MED ORDER — ONDANSETRON 4 MG PO TBDP
4.0000 mg | ORAL_TABLET | Freq: Once | ORAL | Status: AC
  Administered 2021-08-03: 4 mg via ORAL

## 2021-08-03 MED ORDER — AMOXICILLIN-POT CLAVULANATE 875-125 MG PO TABS: 1.0000 | ORAL_TABLET | Freq: Two times a day (BID) | ORAL | 0 refills | Status: DC

## 2021-08-03 MED ORDER — LIDOCAINE VISCOUS HCL 2 % MT SOLN: 10.0000 mL | OROMUCOSAL | 0 refills | Status: AC | PRN

## 2021-08-03 MED ORDER — CHLORHEXIDINE GLUCONATE 0.12 % MT SOLN: 15.0000 mL | Freq: Two times a day (BID) | OROMUCOSAL | 0 refills | Status: AC

## 2021-08-03 NOTE — ED Provider Notes (Signed)
[facility]    CSN: [id number] Arrival date & time: 08/03/21  1221      History   Chief Complaint Chief Complaint  Patient presents with   Appointment    1230    Dental Pain    HPI Miguel Anderson is a 66 y.o. male.   Presenting today with progressively worsening right lower dental pain at the site of a broken molar for the past 1.5 days.  States he is not having jaw pain, nausea and dry heaves due to the pain.  Denies fever, chills, difficulty swallowing or breathing, drainage to the mouth.  Working on getting in with a dentist currently.  Over-the-counter pain relievers not providing any benefit.    Past Medical History:  Diagnosis Date   Hypercholesterolemia     Patient Active Problem List   Diagnosis Date Noted   Ribs, multiple fractures 12/22/2020   Rib fractures 12/21/2020   Palpitations 03/27/2013   Tobacco use disorder 03/27/2013   Chest pain 03/26/2013    History reviewed. No pertinent surgical history.     Home Medications    Prior to Admission medications   Medication Sig Start Date End Date Taking? Authorizing Provider  amoxicillin-clavulanate (AUGMENTIN) 875-125 MG tablet Take 1 tablet by mouth every 12 (twelve) hours. 08/03/21  Yes Particia Nearing, PA-C  chlorhexidine (PERIDEX) 0.12 % solution Use as directed 15 mLs in the mouth or throat 2 (two) times daily. 08/03/21  Yes Particia Nearing, PA-C  lidocaine (XYLOCAINE) 2 % solution Use as directed 10 mLs in the mouth or throat every 3 (three) hours as needed for mouth pain. 08/03/21  Yes Particia Nearing, PA-C  acetaminophen (TYLENOL) 500 MG tablet Take 2 tablets (1,000 mg total) by mouth every 6 (six) hours. 12/24/20   Barnetta Chapel, PA-C  amoxicillin (AMOXIL) 500 MG capsule Take 2 capsules (1,000 mg total) by mouth 3 (three) times daily. 01/01/21   Wallis Bamberg, PA-C  Aspirin-Caffeine (BAYER BACK & BODY PO) Take 1 tablet by mouth 2 (two) times daily.    [provider]  azithromycin (ZITHROMAX) 250 MG tablet Day 1: take 2 tablets. Day 2-5: Take 1 tablet daily. Patient not taking: Reported on 01/12/2021 01/01/21   Wallis Bamberg, PA-C  benzonatate (TESSALON) 100 MG capsule Take 1-2 capsules (100-200 mg total) by mouth 3 (three) times daily as needed for cough. Patient not taking: Reported on 01/12/2021 01/01/21   Wallis Bamberg, PA-C  lidocaine (LIDODERM) 5 % Place 2 patches onto the skin daily. Remove & Discard patch within 12 hours or as directed by MD Patient not taking: Reported on 01/12/2021 12/25/20   Barnetta Chapel, PA-C  LORazepam (ATIVAN) 1 MG tablet Take 1 tablet (1 mg total) by mouth every 6 (six) hours as needed (Dizziness). 01/12/21   Mancel Bale, MD  methocarbamol (ROBAXIN) 500 MG tablet Take 2 tablets (1,000 mg total) by mouth every 8 (eight) hours as needed for muscle spasms. Patient not taking: Reported on 01/12/2021 12/24/20   Barnetta Chapel, PA-C  omeprazole (PRILOSEC) 20 MG capsule Take 1 capsule by mouth daily. Patient not taking: Reported on 12/21/2020 05/14/20   [provider]  oxyCODONE (OXY IR/ROXICODONE) 5 MG immediate release tablet Take 1-2 tablets (5-10 mg total) by mouth every 6 (six) hours as needed for moderate pain. Patient not taking: Reported on 01/12/2021 12/24/20   Barnetta Chapel, PA-C  polyethylene glycol (MIRALAX / GLYCOLAX) 17 g packet Take 17 g by mouth 2 (two) times daily. Patient  not taking: Reported on 01/12/2021 12/24/20   Barnetta Chapel, PA-C  predniSONE (DELTASONE) 20 MG tablet Take 1 tablet (20 mg total) by mouth 2 (two) times daily. 01/12/21   Mancel Bale, MD  promethazine-dextromethorphan (PROMETHAZINE-DM) 6.25-15 MG/5ML syrup Take 5 mLs by mouth at bedtime as needed for cough. Patient not taking: Reported on 01/12/2021 01/01/21   Wallis Bamberg, PA-C  tiZANidine (ZANAFLEX) 4 MG tablet Take 1 tablet (4 mg total) by mouth at bedtime. 01/01/21   Wallis Bamberg, PA-C  traMADol (ULTRAM) 50 MG tablet Take 50  mg by mouth 2 (two) times daily. 01/08/21   [provider]    Family History History reviewed. No pertinent family history.  Social History Social History   Tobacco Use   Smoking status: Every Day    Packs/day: 0.75    Types: Cigarettes   Smokeless tobacco: Never  Substance Use Topics   Alcohol use: Yes    Comment: occ on weekends   Drug use: No   Allergies   Patient has no known allergies.   Review of Systems Review of Systems Per HPI  Physical Exam Triage Vital Signs ED Triage Vitals  Enc Vitals Group     BP 08/03/21 1240 (!) 173/100     Pulse Rate 08/03/21 1240 83     Resp 08/03/21 1240 20     Temp 08/03/21 1240 98 F (36.7 C)     Temp Source 08/03/21 1240 Oral     SpO2 08/03/21 1240 95 %     Weight --      Height --      Head Circumference --      Peak Flow --      Pain Score 08/03/21 1238 7     Pain Loc --      Pain Edu? --      Excl. in GC? --    No data found.  Updated Vital Signs BP (!) 173/100 (BP Location: Right Arm)   Pulse 83   Temp 98 F (36.7 C) (Oral)   Resp 20   SpO2 95%   Visual Acuity Right Eye Distance:   Left Eye Distance:   Bilateral Distance:    Right Eye Near:   Left Eye Near:    Bilateral Near:     Physical Exam Vitals and nursing note reviewed.  Constitutional:      Appearance: Normal appearance.  HENT:     Head: Atraumatic.     Mouth/Throat:     Mouth: Mucous membranes are moist.     Comments: Gingival erythema, edema surrounding broken molar right lower dental region, no obvious abscess, drainage.  Oral airway patent Eyes:     Extraocular Movements: Extraocular movements intact.     Conjunctiva/sclera: Conjunctivae normal.  Cardiovascular:     Rate and Rhythm: Normal rate and regular rhythm.  Pulmonary:     Effort: Pulmonary effort is normal.     Breath sounds: Normal breath sounds.  Musculoskeletal:        General: Normal range of motion.     Cervical back: Normal range of motion and neck supple.   Lymphadenopathy:     Cervical: No cervical adenopathy.  Skin:    General: Skin is warm and dry.  Neurological:     General: No focal deficit present.     Mental Status: He is oriented to person, place, and time.  Psychiatric:        Mood and Affect: Mood normal.  Thought Content: Thought content normal.        Judgment: Judgment normal.      UC Treatments / Results  Labs (all labs ordered are listed, but only abnormal results are displayed) Labs Reviewed - No data to display  EKG   Radiology No results found.  Procedures Procedures (including critical care time)  Medications Ordered in UC Medications  ondansetron (ZOFRAN-ODT) disintegrating tablet 4 mg (4 mg Oral Given 08/03/21 1255)    Initial Impression / Assessment and Plan / UC Course  I have reviewed the triage vital signs and the nursing notes.  Pertinent labs & imaging results that were available during my care of the patient were reviewed by me and considered in my medical decision making (see chart for details).     Hypertensive in triage, otherwise vital signs reassuring.  We will treat dental infection with Augmentin, Peridex rinse, viscous lidocaine.  Over-the-counter pain relievers and supportive care reviewed.  Return for worsening symptoms.  Close follow-up with dentist recommended.  Final Clinical Impressions(s) / UC Diagnoses   Final diagnoses:  Dental infection  Nausea without vomiting   Discharge Instructions   None    ED Prescriptions     Medication Sig Dispense Auth. Provider   amoxicillin-clavulanate (AUGMENTIN) 875-125 MG tablet Take 1 tablet by mouth every 12 (twelve) hours. 14 tablet Particia Nearing, [place]   chlorhexidine (PERIDEX) 0.12 % solution Use as directed 15 mLs in the mouth or throat 2 (two) times daily. 120 mL Particia Nearing, PA-C   lidocaine (XYLOCAINE) 2 % solution Use as directed 10 mLs in the mouth or throat every 3 (three) hours as needed for mouth  pain. 100 mL Particia Nearing, [place]      PDMP not reviewed this encounter.   Particia Nearing, [place] 08/03/21 1258

## 2023-02-16 ENCOUNTER — Ambulatory Visit
Admission: RE | Admit: 2023-02-16 | Discharge: 2023-02-16 | Disposition: A | Discharge status: Home / Self Care | Payer: No Typology Code available for payment source | Source: Ambulatory Visit | Attending: Nurse Practitioner | Admitting: Nurse Practitioner

## 2023-02-16 VITALS — BP 165/88 | HR 69 | Temp 98.0°F | Resp 20

## 2023-02-16 DIAGNOSIS — J069 Acute upper respiratory infection, unspecified: Secondary | ICD-10-CM

## 2023-02-16 DIAGNOSIS — R062 Wheezing: Secondary | ICD-10-CM

## 2023-02-16 DIAGNOSIS — Z87891 Personal history of nicotine dependence: Secondary | ICD-10-CM

## 2023-02-16 MED ORDER — IPRATROPIUM-ALBUTEROL 0.5-2.5 (3) MG/3ML IN SOLN
3.0000 mL | Freq: Once | RESPIRATORY_TRACT | Status: AC
  Administered 2023-02-16: 3 mL via RESPIRATORY_TRACT

## 2023-02-16 MED ORDER — PREDNISONE 20 MG PO TABS: 40.0000 mg | ORAL_TABLET | Freq: Every day | ORAL | 0 refills | Status: AC

## 2023-02-16 MED ORDER — BENZONATATE 100 MG PO CAPS: 100.0000 mg | ORAL_CAPSULE | Freq: Three times a day (TID) | ORAL | 0 refills | Status: AC | PRN

## 2023-02-16 MED ORDER — AZITHROMYCIN 250 MG PO TABS
ORAL_TABLET | ORAL | 0 refills | Status: AC
Start: 2023-02-16 — End: ?

## 2023-02-16 NOTE — ED Triage Notes (Signed)
 Pt reports she has a cough with mucus x 5 days

## 2023-02-16 NOTE — ED Provider Notes (Signed)
 [facility]    CSN: [id number] Arrival date & time: 02/16/23  1522      History   Chief Complaint Chief Complaint  Patient presents with   Cough    Entered by patient    HPI Miguel Anderson is a 68 y.o. male.   Patient presents today with 5 day history of congested cough, chest pain when he coughs, chest congestion and nasal congestion, sore throat that has not improved, decreased appetite, and fatigue.  No fever, body aches or chills, shortness of breath or chest tightness, runny nose, headache, ear pain, abdominal pain, nausea/vomiting, or diarrhea.  Has taken Sudafed for symptoms without improvement.  Patient reports he has inhalers from his PCP at the [facility] from the past times he has been sick.  Patient reports he is a former smoker.  He quit smoking 1 year ago approximately.  History of bronchitis in the past.    Past Medical History:  Diagnosis Date   Hypercholesterolemia     Patient Active Problem List   Diagnosis Date Noted   Ribs, multiple fractures 12/22/2020   Rib fractures 12/21/2020   Palpitations 03/27/2013   Tobacco use disorder 03/27/2013   Chest pain 03/26/2013    History reviewed. No pertinent surgical history.     Home Medications    Prior to Admission medications   Medication Sig Start Date End Date Taking? Authorizing Provider  benzonatate  (TESSALON ) 100 MG capsule Take 1 capsule (100 mg total) by mouth 3 (three) times daily as needed for cough. Do not take with alcohol or while driving or operating heavy machinery.  May cause drowsiness. 02/16/23  Yes Chandra Harlene LABOR, NP  predniSONE  (DELTASONE ) 20 MG tablet Take 2 tablets (40 mg total) by mouth daily with breakfast for 5 days. 02/16/23 02/21/23 Yes Chandra Harlene LABOR, NP  acetaminophen  (TYLENOL ) 500 MG tablet Take 2 tablets (1,000 mg total) by mouth every 6 (six) hours. 12/24/20   Tammy Sor, PA-C  Aspirin-Caffeine (BAYER BACK & BODY PO) Take 1 tablet by mouth 2 (two) times daily.     [provider]  azithromycin  (ZITHROMAX ) 250 MG tablet Day 1: take 2 tablets. Day 2-5: Take 1 tablet daily. 02/16/23   Chandra Harlene LABOR, NP  chlorhexidine  (PERIDEX ) 0.12 % solution Use as directed 15 mLs in the mouth or throat 2 (two) times daily. 08/03/21   Stuart Vernell Norris, PA-C  lidocaine  (LIDODERM ) 5 % Place 2 patches onto the skin daily. Remove & Discard patch within 12 hours or as directed by MD Patient not taking: Reported on 01/12/2021 12/25/20   Tammy Sor, PA-C  lidocaine  (XYLOCAINE ) 2 % solution Use as directed 10 mLs in the mouth or throat every 3 (three) hours as needed for mouth pain. 08/03/21   Stuart Vernell Norris, PA-C  LORazepam  (ATIVAN ) 1 MG tablet Take 1 tablet (1 mg total) by mouth every 6 (six) hours as needed (Dizziness). 01/12/21   Lorriane Holmes, MD  methocarbamol  (ROBAXIN ) 500 MG tablet Take 2 tablets (1,000 mg total) by mouth every 8 (eight) hours as needed for muscle spasms. Patient not taking: Reported on 01/12/2021 12/24/20   Tammy Sor, PA-C  omeprazole (PRILOSEC) 20 MG capsule Take 1 capsule by mouth daily. Patient not taking: Reported on 12/21/2020 05/14/20   [provider]  oxyCODONE  (OXY IR/ROXICODONE ) 5 MG immediate release tablet Take 1-2 tablets (5-10 mg total) by mouth every 6 (six) hours as needed for moderate pain. Patient not taking: Reported on 01/12/2021 12/24/20  Tammy Sor, PA-C  polyethylene glycol (MIRALAX  / GLYCOLAX ) 17 g packet Take 17 g by mouth 2 (two) times daily. Patient not taking: Reported on 01/12/2021 12/24/20   Tammy Sor, PA-C  tiZANidine  (ZANAFLEX ) 4 MG tablet Take 1 tablet (4 mg total) by mouth at bedtime. 01/01/21   Christopher Savannah, PA-C  traMADol  (ULTRAM ) 50 MG tablet Take 50 mg by mouth 2 (two) times daily. 01/08/21   [provider]    Family History History reviewed. No pertinent family history.  Social History Social History   Tobacco Use   Smoking status: Every Day    Current  packs/day: 0.75    Types: Cigarettes   Smokeless tobacco: Never  Substance Use Topics   Alcohol use: Yes    Comment: occ on weekends   Drug use: No     Allergies   Patient has no known allergies.   Review of Systems Review of Systems Per HPI  Physical Exam Triage Vital Signs ED Triage Vitals [02/16/23 1532]  Encounter Vitals Group     BP (!) 165/88     Systolic BP Percentile      Diastolic BP Percentile      Pulse Rate 69     Resp 20     Temp 98 F (36.7 C)     Temp Source Oral     SpO2 94 %     Weight      Height      Head Circumference      Peak Flow      Pain Score      Pain Loc      Pain Education      Exclude from Growth Chart    No data found.  Updated Vital Signs BP (!) 165/88 (BP Location: Right Arm)   Pulse 69   Temp 98 F (36.7 C) (Oral)   Resp 20   SpO2 94%   BP recheck 123/72  SP02 recheck: 97% RA  Visual Acuity Right Eye Distance:   Left Eye Distance:   Bilateral Distance:    Right Eye Near:   Left Eye Near:    Bilateral Near:     Physical Exam Vitals and nursing note reviewed.  Constitutional:      General: He is not in acute distress.    Appearance: Normal appearance. He is not ill-appearing or toxic-appearing.  HENT:     Head: Normocephalic and atraumatic.     Right Ear: Tympanic membrane, ear canal and external ear normal.     Left Ear: Tympanic membrane, ear canal and external ear normal.     Nose: No congestion or rhinorrhea.     Mouth/Throat:     Mouth: Mucous membranes are moist.     Pharynx: Oropharynx is clear. No oropharyngeal exudate or posterior oropharyngeal erythema.  Eyes:     General: No scleral icterus.    Extraocular Movements: Extraocular movements intact.  Cardiovascular:     Rate and Rhythm: Normal rate and regular rhythm.  Pulmonary:     Effort: Pulmonary effort is normal. No respiratory distress.     Breath sounds: Normal breath sounds. Decreased air movement present. No wheezing, rhonchi or rales.   Musculoskeletal:     Cervical back: Normal range of motion and neck supple.  Lymphadenopathy:     Cervical: No cervical adenopathy.  Skin:    General: Skin is warm and dry.     Coloration: Skin is not jaundiced or pale.     Findings: No  erythema or rash.  Neurological:     Mental Status: He is alert and oriented to person, place, and time.  Psychiatric:        Behavior: Behavior is cooperative.      UC Treatments / Results  Labs (all labs ordered are listed, but only abnormal results are displayed) Labs Reviewed - No data to display  EKG   Radiology No results found.  Procedures Procedures (including critical care time)  Medications Ordered in UC Medications  ipratropium-albuterol  (DUONEB) 0.5-2.5 (3) MG/3ML nebulizer solution 3 mL (3 mLs Nebulization Given 02/16/23 1559)    Initial Impression / Assessment and Plan / UC Course  I have reviewed the triage vital signs and the nursing notes.  Pertinent labs & imaging results that were available during my care of the patient were reviewed by me and considered in my medical decision making (see chart for details).   Patient is well-appearing, normotensive, afebrile, not tachycardic, not tachypneic, oxygenating well on room air.  Pressure and SpO2 improved after DuoNeb.  1. Viral URI with cough 2. Wheezing 3. History of smoking Suspect viral etiology, however given smoking history and wheezing today, will treat for chronic bronchitis exacerbation Start using rescue inhaler every 4-6 hours scheduled for the next 48 hours, then use as needed Start oral prednisone , azithromycin , cough suppressant medication Stop Sudafed ER and return precautions discussed with patient  The patient was given the opportunity to ask questions.  All questions answered to their satisfaction.  The patient is in agreement to this plan.    Final Clinical Impressions(s) / UC Diagnoses   Final diagnoses:  Viral URI with cough  Wheezing  History  of smoking     Discharge Instructions      You have a viral upper respiratory infection that is causing you to wheeze a little bit.  We gave you a breathing treatment today which helped open up your airway.  Take the oral prednisone  as prescribed as well as the azithromycin .  Start using your inhaler every 4-6 hours scheduled for 2 days, then back to use as needed.  Symptoms should improve over the next week to 10 days.  If you develop chest pain or shortness of breath, go to the emergency room.  Some things that can make you feel better are: - Increased rest - Increasing fluid with water/sugar free electrolytes - Acetaminophen  and ibuprofen as needed for fever/pain - Salt water gargling, chloraseptic spray and throat lozenges - OTC guaifenesin (Mucinex) 600 mg twice daily - Saline sinus flushes or a neti pot - Humidifying the air -Tessalon  Perles every 8 hours as needed for dry cough      ED Prescriptions     Medication Sig Dispense Auth. Provider   azithromycin  (ZITHROMAX ) 250 MG tablet Day 1: take 2 tablets. Day 2-5: Take 1 tablet daily. 6 tablet Chandra Raisin A, NP   predniSONE  (DELTASONE ) 20 MG tablet Take 2 tablets (40 mg total) by mouth daily with breakfast for 5 days. 10 tablet Chandra Raisin A, NP   benzonatate  (TESSALON ) 100 MG capsule Take 1 capsule (100 mg total) by mouth 3 (three) times daily as needed for cough. Do not take with alcohol or while driving or operating heavy machinery.  May cause drowsiness. 21 capsule Chandra Raisin LABOR, NP      PDMP not reviewed this encounter.   Chandra Raisin LABOR, NP 02/16/23 1718

## 2023-02-16 NOTE — Discharge Instructions (Addendum)
 You have a viral upper respiratory infection that is causing you to wheeze a little bit.  We gave you a breathing treatment today which helped open up your airway.  Take the oral prednisone  as prescribed as well as the azithromycin .  Start using your inhaler every 4-6 hours scheduled for 2 days, then back to use as needed.  Symptoms should improve over the next week to 10 days.  If you develop chest pain or shortness of breath, go to the emergency room.  Some things that can make you feel better are: - Increased rest - Increasing fluid with water/sugar free electrolytes - Acetaminophen  and ibuprofen as needed for fever/pain - Salt water gargling, chloraseptic spray and throat lozenges - OTC guaifenesin (Mucinex) 600 mg twice daily - Saline sinus flushes or a neti pot - Humidifying the air -Tessalon  Perles every 8 hours as needed for dry cough

## 2023-03-17 IMAGING — CT CT HEAD W/O CM
3 series · 16 of 47 positions shown, 19 images · non-contrast
Comparison: None.

CLINICAL DATA: Nausea and dizziness.

EXAM:
CT HEAD WITHOUT CONTRAST
TECHNIQUE: Contiguous axial images were obtained from the base of the skull
through the vertex without intravenous contrast.

[Series 2: head w o · axial · 0.41mm/px · z∈[+1263,+1403]mm · 10 of 34 slices shown, 13 images]
[im 3/34  brain]
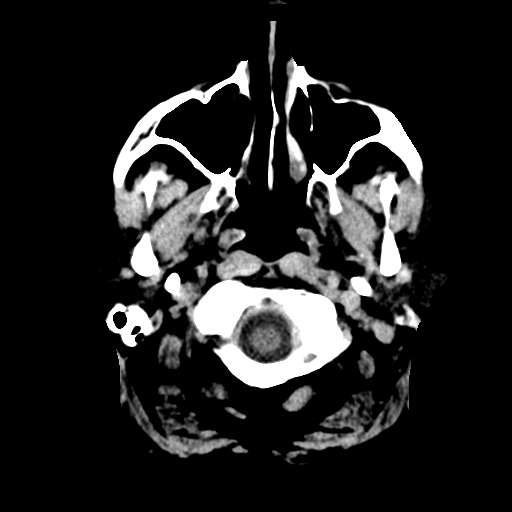
[im 3/34  bone]
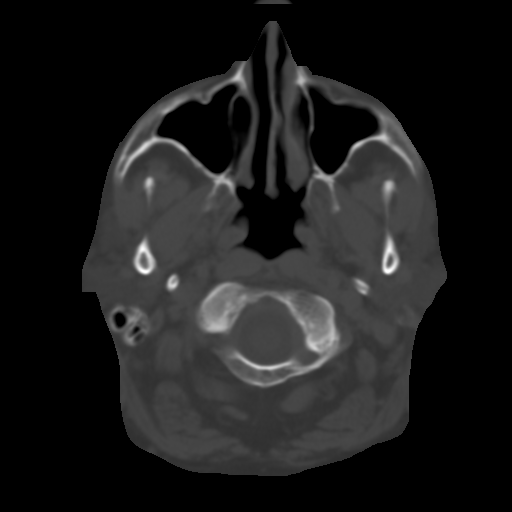
[im 6/34  brain]
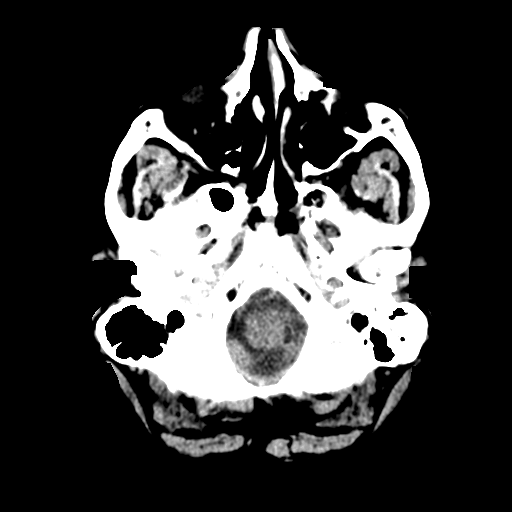
[im 10/34  brain]
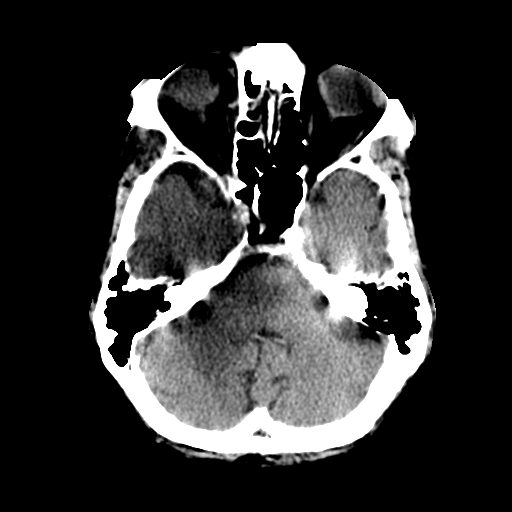
[im 12/34  brain]
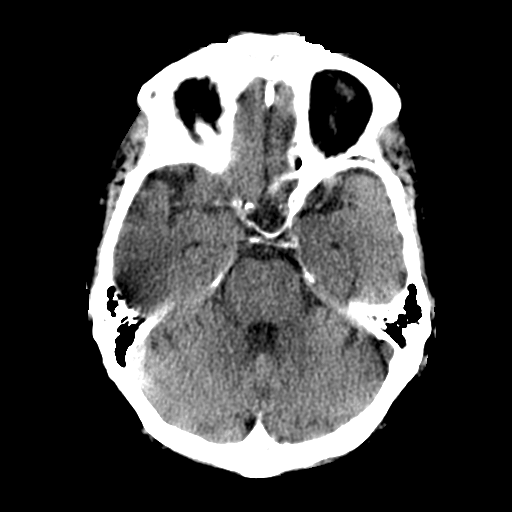
[im 15/34  brain]
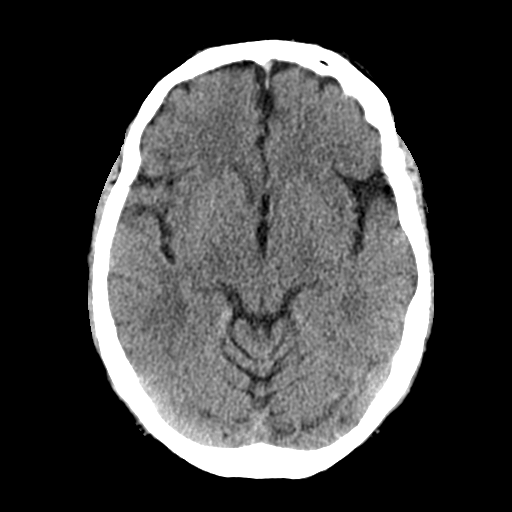
[im 15/34  bone]
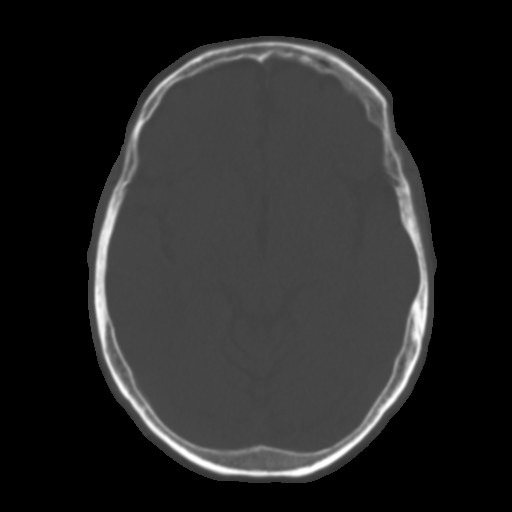
[im 19/34  brain]
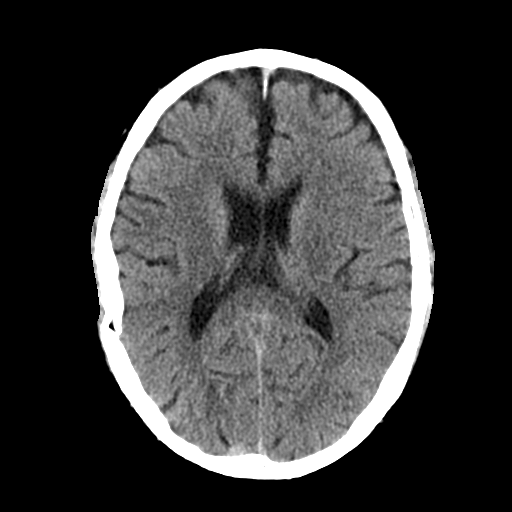
[im 22/34  brain]
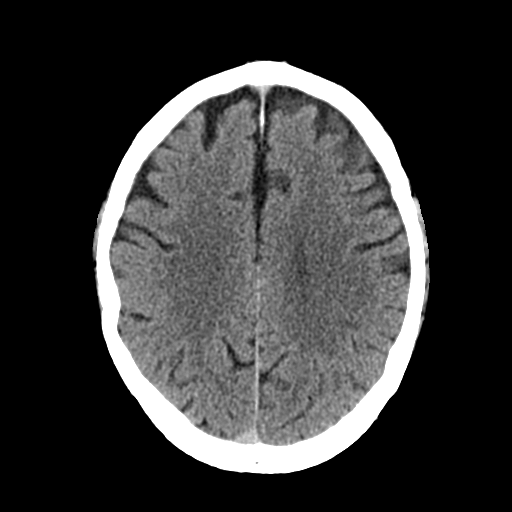
[im 26/34  brain]
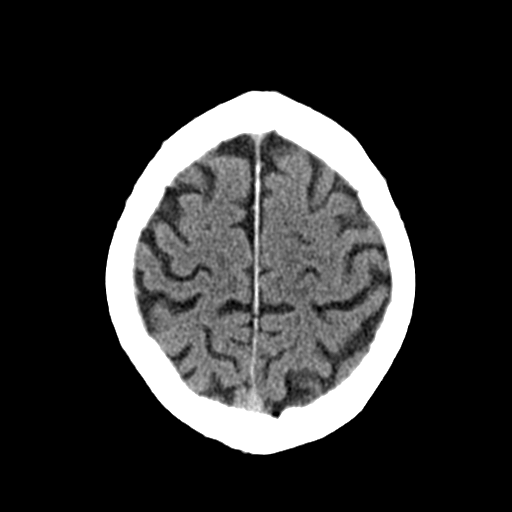
[im 28/34  brain]
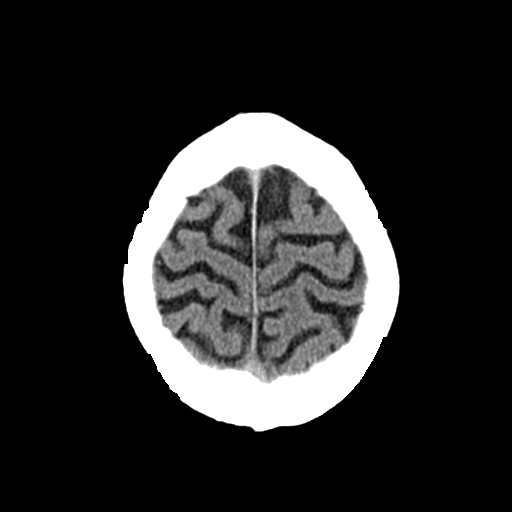
[im 28/34  bone]
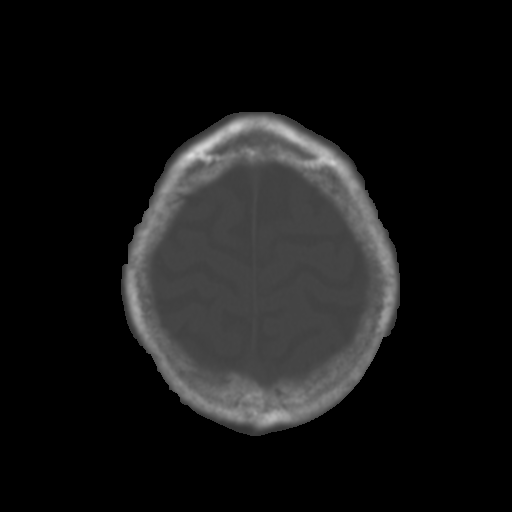
[im 31/34  brain]
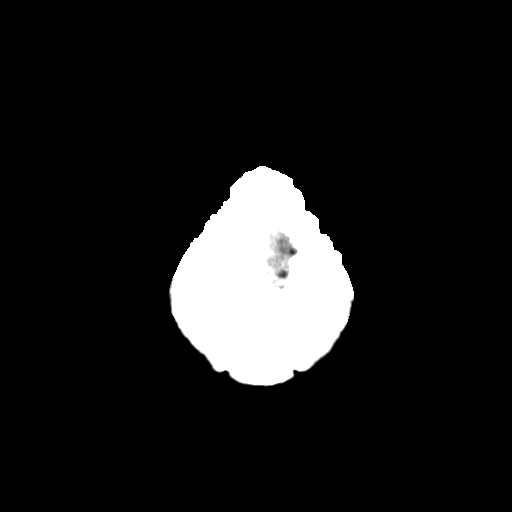

[Series 3: sagittal soft · sagittal · 0.38mm/px · 3 of 56 slices shown]
[im 19/56  brain]
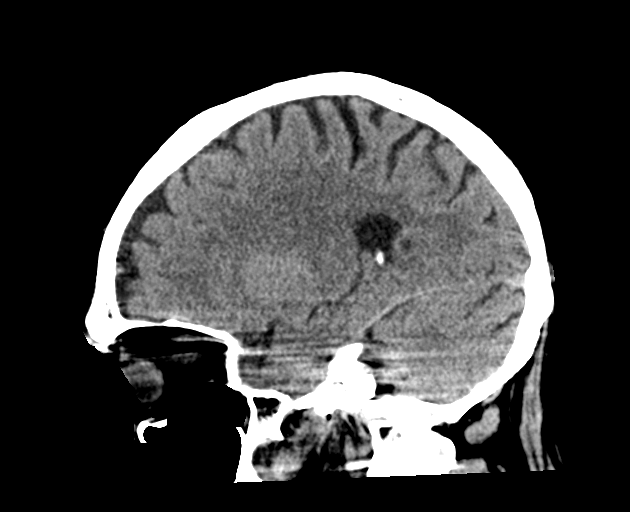
[im 28/56  brain]
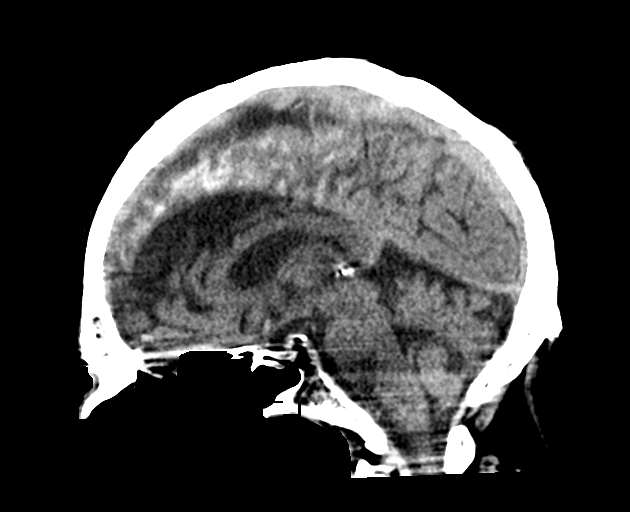
[im 37/56  brain]
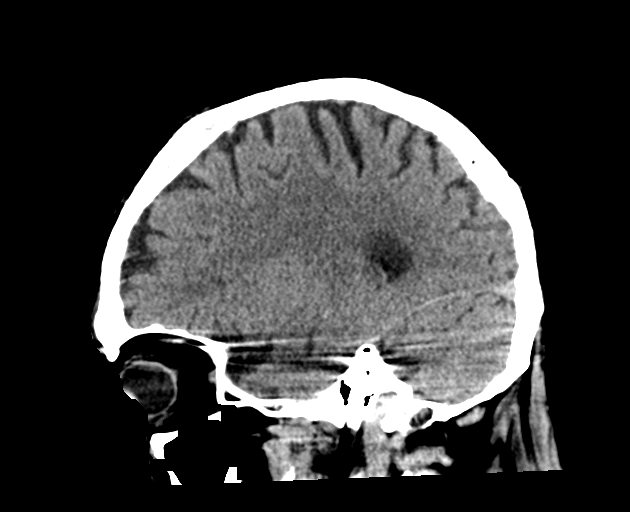

[Series 4: coronal soft · coronal · 0.35mm/px · 3 of 82 slices shown]
[im 28/82  brain]
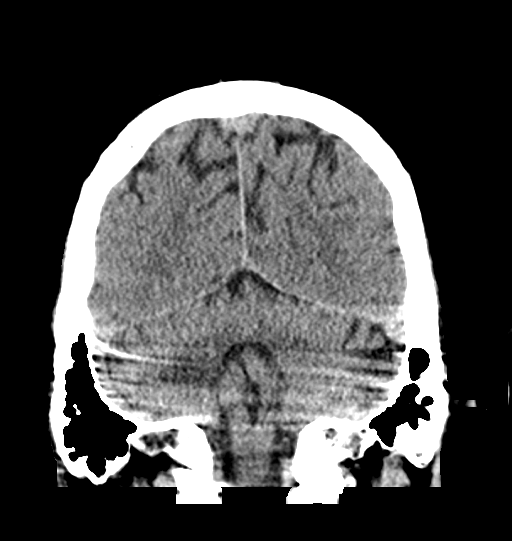
[im 37/82  brain]
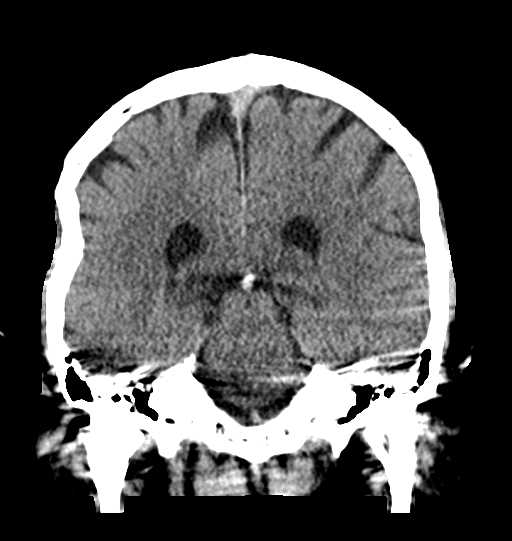
[im 46/82  brain]
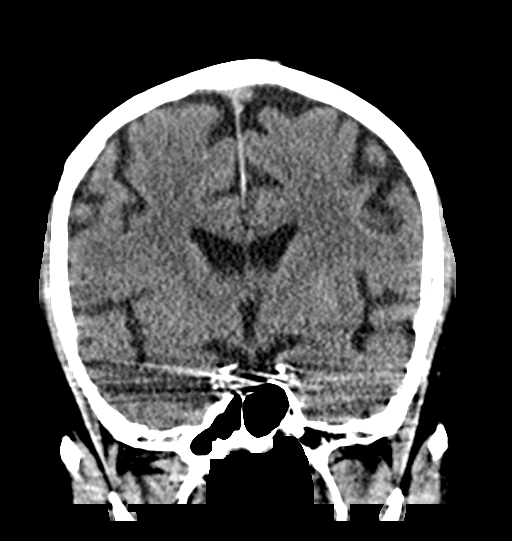

[16 of 47 positions shown; findings below may reference images not displayed]

FINDINGS: Brain: No evidence of acute infarction, hemorrhage, hydrocephalus,
extra-axial collection or mass lesion/mass effect. Mild generalized
cerebral atrophy.

Vascular: Atherosclerotic vascular calcification of the carotid
siphons. No hyperdense vessel.

Skull: Negative for acute fracture or focal lesion. Old healed
depressed right parietal bone fracture.

Sinuses/Orbits: No acute finding.

Other: None.
IMPRESSION: 1. No acute intracranial abnormality.
